# Patient Record
Sex: Female | Born: 1975
Health system: Southern US, Community
[De-identification: ages and names within clinical notes are randomized; demographics above are authoritative.]

## PROBLEM LIST (undated history)

## (undated) DIAGNOSIS — T7840XA Allergy, unspecified, initial encounter: Secondary | ICD-10-CM

## (undated) HISTORY — DX: Allergy, unspecified, initial encounter: T78.40XA

---

## 1996-03-01 HISTORY — PX: WISDOM TOOTH EXTRACTION: SHX21

## 1999-03-05 ENCOUNTER — Other Ambulatory Visit: Admission: RE | Admit: 1999-03-05 | Discharge: 1999-03-05 | Payer: Self-pay | Admitting: Obstetrics and Gynecology

## 2000-03-04 ENCOUNTER — Other Ambulatory Visit: Admission: RE | Admit: 2000-03-04 | Discharge: 2000-03-04 | Payer: Self-pay | Admitting: Obstetrics and Gynecology

## 2002-09-06 ENCOUNTER — Other Ambulatory Visit: Admission: RE | Admit: 2002-09-06 | Discharge: 2002-09-06 | Payer: Self-pay | Admitting: Family Medicine

## 2003-10-17 ENCOUNTER — Other Ambulatory Visit: Admission: RE | Admit: 2003-10-17 | Discharge: 2003-10-17 | Payer: Self-pay | Admitting: Family Medicine

## 2005-04-06 ENCOUNTER — Inpatient Hospital Stay (HOSPITAL_COMMUNITY): Admission: RE | Admit: 2005-04-06 | Discharge: 2005-04-09 | Payer: Self-pay | Admitting: Obstetrics & Gynecology

## 2010-05-20 ENCOUNTER — Encounter (HOSPITAL_COMMUNITY)
Admission: RE | Admit: 2010-05-20 | Discharge: 2010-05-20 | Disposition: A | Discharge status: Home / Self Care | Payer: PRIVATE HEALTH INSURANCE | Source: Ambulatory Visit | Attending: Obstetrics & Gynecology | Admitting: Obstetrics & Gynecology

## 2010-05-20 DIAGNOSIS — Z01812 Encounter for preprocedural laboratory examination: Secondary | ICD-10-CM | POA: Insufficient documentation

## 2010-05-20 LAB — CBC
HCT: 33.7 % — ABNORMAL LOW (ref 36.0–46.0)
MCH: 27.9 pg (ref 26.0–34.0)
MCV: 80.4 fL (ref 78.0–100.0)
Platelets: 323 10*3/uL (ref 150–400)
RBC: 4.19 MIL/uL (ref 3.87–5.11)
RDW: 13.9 % (ref 11.5–15.5)

## 2010-05-20 LAB — RPR: RPR Ser Ql: NONREACTIVE

## 2010-05-21 ENCOUNTER — Other Ambulatory Visit: Payer: Self-pay | Admitting: Obstetrics & Gynecology

## 2010-05-21 ENCOUNTER — Inpatient Hospital Stay (HOSPITAL_COMMUNITY)
Admission: RE | Admit: 2010-05-21 | Discharge: 2010-05-25 | DRG: 766 | Disposition: A | Discharge status: Home / Self Care | Payer: PRIVATE HEALTH INSURANCE | Source: Ambulatory Visit | Attending: Obstetrics & Gynecology | Admitting: Obstetrics & Gynecology

## 2010-05-21 DIAGNOSIS — D259 Leiomyoma of uterus, unspecified: Secondary | ICD-10-CM | POA: Diagnosis present

## 2010-05-21 DIAGNOSIS — O34219 Maternal care for unspecified type scar from previous cesarean delivery: Principal | ICD-10-CM | POA: Diagnosis present

## 2010-05-21 DIAGNOSIS — D4959 Neoplasm of unspecified behavior of other genitourinary organ: Secondary | ICD-10-CM | POA: Diagnosis present

## 2010-05-21 DIAGNOSIS — Z01812 Encounter for preprocedural laboratory examination: Secondary | ICD-10-CM

## 2010-05-21 DIAGNOSIS — O34599 Maternal care for other abnormalities of gravid uterus, unspecified trimester: Secondary | ICD-10-CM | POA: Diagnosis present

## 2010-05-21 DIAGNOSIS — Z01818 Encounter for other preprocedural examination: Secondary | ICD-10-CM

## 2010-05-21 DIAGNOSIS — O3660X0 Maternal care for excessive fetal growth, unspecified trimester, not applicable or unspecified: Secondary | ICD-10-CM | POA: Diagnosis present

## 2010-05-22 ENCOUNTER — Other Ambulatory Visit (HOSPITAL_COMMUNITY): Payer: PRIVATE HEALTH INSURANCE

## 2010-05-22 LAB — CBC
HCT: 31.8 % — ABNORMAL LOW (ref 36.0–46.0)
Hemoglobin: 10.8 g/dL — ABNORMAL LOW (ref 12.0–15.0)
MCH: 27.5 pg (ref 26.0–34.0)
MCHC: 34 g/dL (ref 30.0–36.0)
MCV: 80.9 fL (ref 78.0–100.0)
Platelets: 316 K/uL (ref 150–400)
RBC: 3.93 MIL/uL (ref 3.87–5.11)
RDW: 14 % (ref 11.5–15.5)
WBC: 13.4 K/uL — ABNORMAL HIGH (ref 4.0–10.5)

## 2010-05-22 LAB — ABO/RH: ABO/RH(D): O POS

## 2010-05-24 LAB — CBC
HCT: 29.5 % — ABNORMAL LOW (ref 36.0–46.0)
MCH: 28.1 pg (ref 26.0–34.0)
MCV: 79.7 fL (ref 78.0–100.0)
Platelets: 332 10*3/uL (ref 150–400)
RBC: 3.7 MIL/uL — ABNORMAL LOW (ref 3.87–5.11)
WBC: 10.2 10*3/uL (ref 4.0–10.5)

## 2010-05-29 ENCOUNTER — Inpatient Hospital Stay (HOSPITAL_COMMUNITY)
Admission: AD | Admit: 2010-05-29 | Payer: PRIVATE HEALTH INSURANCE | Source: Ambulatory Visit | Admitting: Obstetrics & Gynecology

## 2010-06-30 ENCOUNTER — Inpatient Hospital Stay (HOSPITAL_COMMUNITY): Admission: AD | Admit: 2010-06-30 | Payer: Self-pay | Source: Home / Self Care | Admitting: Obstetrics and Gynecology

## 2010-07-02 ENCOUNTER — Other Ambulatory Visit: Payer: Self-pay | Admitting: Obstetrics & Gynecology

## 2010-07-17 NOTE — Op Note (Signed)
[name], [name]            ACCOUNT NO.:  [id number]   MEDICAL RECORD NO.:  [id number]          PATIENT TYPE:  INP   LOCATION:  9132                          FACILITY:  [facility]   PHYSICIAN:  Genia Del, M.D.DATE OF BIRTH:  1975/05/29   DATE OF PROCEDURE:  04/06/2005  DATE OF DISCHARGE:                                 OPERATIVE REPORT   PREOPERATIVE DIAGNOSIS:  Thirty-nine plus weeks' gestation with suspicion of  macrosomia.   POSTOPERATIVE DIAGNOSIS:  Thirty-nine plus weeks' gestation with suspicion  of macrosomia.   INTERVENTION:  Elective primary low transverse cesarean section.   SURGEON:  Genia Del, M.D.   ASSISTANT:  Marlinda Mike, C.N.M.   ANESTHESIOLOGIST:  Burnett Corrente, M.D.   PROCEDURE:  Under spinal anesthesia, the patient is in a 15 degree low back  pain.  She is prepped with Betadine on the abdominal, suprapubic, vulvar and  vaginal area.  The bladder catheter is put in place and the patient is  draped as usual.  An infiltration of Marcaine 0.25% plain 10 mL is done at  the future site of Pfannenstiel.  We then make a Pfannenstiel incision with  a scalpel.  We open the adipose tissue and the aponeurosis transversely with  the electrocautery cutting mode.  We use the coagulation when necessary.  We  complete the opening of the aponeurosis on each side with Mayo scissors.  The aponeurosis is separate from the recti muscles on the midline superiorly  and inferiorly.  The parietal peritoneum is opened longitudinally with  Metzenbaum scissors.  We then put the bladder retractor in place.  The  visceral peritoneum is opened transversely with Metzenbaum scissors over the  lower uterine segment.  The bladder is reclined downward.  We then make a  low transverse hysterotomy with a scalpel, extend on each side with dressing  scissors.  The amniotic fluid is clear.  The fetus is in cephalic  presentation.  Birth of a baby boy at 12:45, a loose nuchal cord  is present.  We then suction the baby after delivery of the head.  The cord is clamped  and cut.  The baby is given to the neonatal team.  Apgars are 9 and 9.  The  weight of the baby is 11 pounds.  It is a baby boy.  We evacuate the  placenta spontaneously.  A cord blood was taken before, then the placenta  and the cord are sent for cord blood banking.  We make a uterine revision.  The uterus contracts well with Pitocin IV.  A dose of Ancef 1 g IV is given  after cord clamping.  We then close the hysterotomy with a first running  locked suture of 0 Vicryl.  A second plane in a mattress fashion is done  with 0 Vicryl.  Hemostasis is adequate.  We then verify hemostasis on the  bladder flap and the recti muscles.  It is completed with the electrocautery  where necessary.  Note that both ovaries were normal to inspection, both  tubes are normal to inspection.  The uterus had multiple myomas, especially  a  subserosal one on either side close to the fundus.  The one on the left  side was adherent to the omentum and it was freed from it with the  electrocautery with coagulation mode.  Hemostasis was adequate at both  levels.  We then close the aponeurosis with two half running sutures of 0  Vicryl.  We complete hemostasis on the adipose  tissue with the electrocautery and we approximate the skin with staples.  The count of instruments and sponges was complete x2.  A dry compressive  dressing was applied on the incision.  The estimated blood loss was 700 mL,  no complication occurred, and the patient was transferred to recovery room  in good, stable status.      Genia Del, M.D.  Electronically Signed     ML/MEDQ  D:  04/06/2005  T:  04/06/2005  Job:  [id number]

## 2010-07-17 NOTE — Discharge Summary (Signed)
[name], [name]            ACCOUNT NO.:  [id number]   MEDICAL RECORD NO.:  [id number]          PATIENT TYPE:  INP   LOCATION:  9132                          [facility]ILITY[facility]  [facility]   PHYSICIAN:  Genia Del, M.D.DATE OF BIRTH:  May 16, 1975   DATE OF ADMISSION:  04/06/2005  DATE OF DISCHARGE:  04/09/2005                                 DISCHARGE SUMMARY   ADMISSION DIAGNOSIS:  A 39+ weeks gestation, suspicion of macrosomia.   DISCHARGE DIAGNOSIS:  1.  A 39+ weeks gestation, suspicion of macrosomia.  2.  Confirmed macrosomia, baby boy 11 pounds.   INTERVENTION:  Elective low-transverse C-section.   HOSPITAL COURSE:  The patient was admitted on the day of her surgery.  She  had a primary low-transverse C-section.  A baby boy was born, 11 pounds,  Apgars 9 and 9.  The low-transverse hysterotomy was repaired in 2 plains.  The estimated blood loss was 700 mL.  No complication occurred.  The postop  evolution was unremarkable.  The patient remained afebrile and  hemodynamically stable.  Her hemoglobin postop was 11.3 with a hematocrit of  32.3.  The patient was discharged in stable status on postop day #3.  Postop  advice was given.  The patient was prescribed Percocet p.r.n. for pain.  She  will follow up at [facility] in 4 weeks for postop visit.      Genia Del, M.D.  Electronically Signed     ML/MEDQ  D:  04/29/2005  T:  04/29/2005  Job:  [id number]

## 2010-07-29 NOTE — Op Note (Signed)
[name], [name]            ACCOUNT NO.:  [id number]  MEDICAL RECORD NO.:  [id number]           PATIENT TYPE:  I  LOCATION:  9104                          [facility]ILITY:  [facility]  PHYSICIAN:  Genia Del, M.D.DATE OF BIRTH:  18-Jun-1975  DATE OF PROCEDURE:  05/21/2010 DATE OF DISCHARGE:                              OPERATIVE REPORT   PREOPERATIVE DIAGNOSES:  A 39 weeks' previous cesarean section, suspicion of macrosomia.  POSTOPERATIVE DIAGNOSES:  A 39 weeks' previous cesarean section, confirmed macrosomia, weight 10 pounds 14 ounces, myomas and adhesions.  PROCEDURES:  Repeat low-transverse cesarean section, lysis of adhesions and myomectomy.  SURGEON:  Genia Del, MD  ASSISTANT:  Arlan Organ, MD  ANESTHESIOLOGIST:  Brayton Caves, MD  PROCEDURE:  Under spinal anesthesia, the patient was in 15 degrees left decubitus position.  She was prepped with ChloraPrep on the abdomen and Betadine on the suprapubic vulvar and vaginal areas.  The Foley was put in place in the bladder and the patient was draped as usual.  The level of anesthesia was verified and was adequate.  We infiltrated the previous Pfannenstiel incision with Marcaine one-quarter plain 10 mL. We then proceed with a Pfannenstiel incision at the site of the previous scar with a scalpel.  We opened the adipose tissue with a scalpel and then with the electrocautery cutting mode.  We used the coag when necessary.  We opened the aponeurosis transversely with the electrocautery cutting mode and then with the Mayo scissors.  We then opened the parietal peritoneum longitudinally with [facility] scissors at the superior aspect.  When we arrived midway, there was very thick adhesions between the peritoneum and the uterus and the bladder inferiorly.  We very carefully released those adhesions with Mayo scissors and the electrocautery.  We were able to descent the bladder safely and reached the lower uterine segment.   The severe dense adhesions were on the midline.  We put the bladder retractor in place.  We make a low- transverse incision on the uterus with a scalpel.  We extend on each side with dressing scissors.  The amniotic fluid was clear.  The fetus was in cephalic presentation.  Birth of a baby girl at 53. The cord was clamped and cut.  The baby was suctioned and given to the Neonatal Team.  Apgars are 9 and 9, the weight was 10 pounds and 14 ounces.  The placenta was evacuated manually and sent to Labor and Delivery.  Uterine revision done.  Pitocin started in the IV fluid.  Note that, the patient received Ancef 1 g IV before starting the procedure.  The uterus contracts well.  We note severe adhesions between the omentum and anterior subserosal myoma.  That myoma was about 2.5-cm in diameter.  It was calcified and the decision was taken to proceed with a myomectomy to release the omental adhesions more easily.  We control hemostasis with the electrocautery and coag mode.  We also used Vicryl 3-0 on the omentum.  We closed the site where the myoma was with figure-of-eights with Vicryl 0 and then finished with a Vicryl 3-0.  Hemostasis was adequate at that level.  We also released some adhesions on the lower right side of the uterus with the peritoneum.  We had to close the first layer of the hysterotomy before proceeding to the myomectomy and so that was closed with a full plain locked suture of Vicryl 0 and a second plane in a mattress stitch of Vicryl 0 was done that completed hemostasis very well at that level, where we had to release adhesions before making the hysterotomy.  We repaired those areas with figure-of- eights with Vicryl 0 and that completed hemostasis at the same time. Both ovaries and both tubes were normal to inspection.  We irrigated and suctioned the abdominopelvic cavities.  We then used Interceed at the hysterotomy and also on the site of the myomectomy and where  the adhesions were released just superior to the incision on the uterus.  We then closed the parietal peritoneum with the running suture of Vicryl 2- 0.  We closed the aponeurosis with two half running suture of Vicryl 0, completed hemostasis on the adipose tissue with the electrocautery and reapproximated the skin with staples.  A compressive dry dressing was applied.  The count of instruments and sponges was complete.  The estimated blood loss was 800 mL.  No complications occurred and the patient was brought to recovery room in good stable status.     Genia Del, M.D.     ML/MEDQ  D:  05/21/2010  T:  05/22/2010  Job:  [id number]  Electronically Signed by Genia Del M.D. on 07/29/2010 05:03:00 PM

## 2013-01-08 DIAGNOSIS — E669 Obesity, unspecified: Secondary | ICD-10-CM | POA: Insufficient documentation

## 2013-01-08 DIAGNOSIS — E6609 Other obesity due to excess calories: Secondary | ICD-10-CM | POA: Insufficient documentation

## 2013-01-08 DIAGNOSIS — K5900 Constipation, unspecified: Secondary | ICD-10-CM | POA: Insufficient documentation

## 2015-06-17 DIAGNOSIS — Z1151 Encounter for screening for human papillomavirus (HPV): Secondary | ICD-10-CM | POA: Diagnosis not present

## 2015-06-17 DIAGNOSIS — Z01419 Encounter for gynecological examination (general) (routine) without abnormal findings: Secondary | ICD-10-CM | POA: Diagnosis not present

## 2015-06-17 DIAGNOSIS — Z6832 Body mass index (BMI) 32.0-32.9, adult: Secondary | ICD-10-CM | POA: Diagnosis not present

## 2015-06-24 ENCOUNTER — Ambulatory Visit (INDEPENDENT_AMBULATORY_CARE_PROVIDER_SITE_OTHER): Payer: BLUE CROSS/BLUE SHIELD | Admitting: Family Medicine

## 2015-06-24 ENCOUNTER — Encounter: Payer: Self-pay | Admitting: Family Medicine

## 2015-06-24 VITALS — BP 99/64 | HR 77 | Ht 62.0 in | Wt 178.0 lb

## 2015-06-24 DIAGNOSIS — E663 Overweight: Secondary | ICD-10-CM

## 2015-06-24 LAB — TSH: TSH: 2.61 mIU/L

## 2015-06-24 MED ORDER — PHENTERMINE HCL 37.5 MG PO TABS: 37.5000 mg | ORAL_TABLET | Freq: Every day | ORAL | Status: DC

## 2015-06-24 NOTE — Patient Instructions (Signed)
Other options in the future include: Qsymia, Belviq, Saxenda, or Contrave

## 2015-06-24 NOTE — Progress Notes (Signed)
CC: EXER [name] is a 40 y.o. female is here for Establish Care   Subjective: HPI:  [name] here to establish care.  She tells me that she wants some help with losing weight. She's tried her best to cut out junk food however just can't seem to be successful. No formal exercise routine. She's been trying to get into an exercise routine for many months now. Overall she's frustrated with lack of results but admits lack of aggressiveness to achieve these results. Symptoms have been slowly worsening with respect to weight gain over the past 2 or 3 years.   Review of Systems - General ROS: negative for - chills, fever, night sweats,  weight loss Ophthalmic ROS: negative for - decreased vision Psychological ROS: negative for - anxiety or depression ENT ROS: negative for - hearing change, nasal congestion, tinnitus or allergies Hematological and Lymphatic ROS: negative for - bleeding problems, bruising or swollen lymph nodes Breast ROS: negative Respiratory ROS: no cough, shortness of breath, or wheezing Cardiovascular ROS: no chest pain or dyspnea on exertion Gastrointestinal ROS: no abdominal pain, change in bowel habits, or black or bloody stools Genito-Urinary ROS: negative for - genital discharge, genital ulcers, incontinence or abnormal bleeding from genitals Musculoskeletal ROS: negative for - joint pain or muscle pain Neurological ROS: negative for - headaches or memory loss Dermatological ROS: negative for lumps, mole changes, rash and skin lesion changes  History reviewed. No pertinent past medical history.  Past Surgical History  Procedure Laterality Date  . Cesarean section  2007  . Cesarean section  2012   History reviewed. No pertinent family history.  Social History   Social History  . Marital Status: Married    Spouse Name: N/A  . Number of Children: N/A  . Years of Education: N/A   Occupational History  . Not on file.   Social History Main  Topics  . Smoking status: Never Smoker   . Smokeless tobacco: Never Used  . Alcohol Use: No  . Drug Use: No  . Sexual Activity: Yes    Birth Control/ Protection: IUD   Other Topics Concern  . Not on file   Social History Narrative  . No narrative on file     Objective: BP 99/64 mmHg  Pulse 77  Ht 5\' 2"  (1.575 m)  Wt 178 lb (80.74 kg)  BMI 32.55 kg/m2  General: Alert and Oriented, No Acute Distress HEENT: Pupils equal, round, reactive to light. Conjunctivae clear. Moist mucous membranes Lungs: Clear to auscultation bilaterally, no wheezing/ronchi/rales.  Comfortable work of breathing. Good air movement. Cardiac: Regular rate and rhythm. Normal S1/S2.  No murmurs, rubs, nor gallops.   Abdomen: Mild truncal obesity Extremities: No peripheral edema.  Strong peripheral pulses.  Mental Status: No depression, anxiety, nor agitation. Skin: Warm and dry.  Assessment & Plan: [name] was seen today for establish care.  Diagnoses and all orders for this visit:  Overweight -     TSH  Other orders -     phentermine (ADIPEX-P) 37.5 MG tablet; Take 1 tablet (37.5 mg total) by mouth daily before breakfast.   Overweight: Discussed importance of ruling out hypothyroidism and if normal she can start phentermine. We discussed that she'll need to actively be losing weight in order to get refills for this medication and that sometime in the next few months and probably lose its effectiveness and she'll need to switch to a different weight loss medication if she still desiring further weight loss. These  were options were provided to her in the patient instructions on her a AVS  Return if symptoms worsen or fail to improve.

## 2015-07-22 ENCOUNTER — Ambulatory Visit (INDEPENDENT_AMBULATORY_CARE_PROVIDER_SITE_OTHER): Payer: Self-pay | Admitting: Family Medicine

## 2015-07-22 VITALS — BP 111/72 | HR 86 | Wt 175.0 lb

## 2015-07-22 DIAGNOSIS — R635 Abnormal weight gain: Secondary | ICD-10-CM

## 2015-07-22 MED ORDER — PHENTERMINE HCL 37.5 MG PO TABS: 37.5000 mg | ORAL_TABLET | Freq: Every day | ORAL | Status: DC

## 2015-07-22 NOTE — Progress Notes (Signed)
Success, refill provided.

## 2015-07-22 NOTE — Progress Notes (Signed)
Patient is here for blood pressure and weight check. Denies any trouble sleeping, palpitations, or any other medication problems. Patient has lost weight. A refill for Phentermine will be sent to patient preferred pharmacy. Patient advised to schedule a four week nurse visit and keep her upcoming appointment with her PCP. Verbalized understanding, no further questions.

## 2015-08-25 ENCOUNTER — Ambulatory Visit: Payer: Self-pay

## 2015-09-04 ENCOUNTER — Ambulatory Visit (INDEPENDENT_AMBULATORY_CARE_PROVIDER_SITE_OTHER): Payer: Self-pay | Admitting: Osteopathic Medicine

## 2015-09-04 VITALS — BP 97/60 | HR 66 | Wt 177.0 lb

## 2015-09-04 DIAGNOSIS — R635 Abnormal weight gain: Secondary | ICD-10-CM

## 2015-09-04 NOTE — Progress Notes (Signed)
   Subjective:    Patient ID: [name], female    DOB: 07-Nov-1975, 40 y.o.   MRN: [id number]  HPI  Patient is here for blood pressure and weight check. Denies any trouble sleeping, palpitations, or any other medication problems.  Review of Systems     Objective:   Physical Exam        Assessment & Plan:   Patient has not loss weight. A refill for Phentermine will be reviewed by a provider and if appropriate will be faxed to the pharmacy on file. Patient advised that she will be contacted regarding refill. Verbalized understanding, no further questions.

## 2015-09-04 NOTE — Progress Notes (Signed)
Pt notified and advised to schedule appt with PCP to discuss alternate options. Pt verbalized understanding and was transferred to scheduling.

## 2015-09-04 NOTE — Progress Notes (Signed)
Nurse notes were reviewed. Patient has been taking phentermine at maximum dose for 2 months. Last refill was 07/22/2015. Has lost total of 1 pound since initiation of the medication. I would defer refill at this time, will route this note to her PCP, who may consider alternative medication for her or discuss diet/exercise changes.   Vitals - 1 value per visit 09/04/2015 07/22/2015 [id number]  SYSTOLIC [id number]  DIASTOLIC 60 72 64  Pulse 66 86 77  Weight (lb) 177 175 178  Height   5\' 2"   BMI 32.37 32 32.55  VISIT REPORT

## 2015-09-17 ENCOUNTER — Ambulatory Visit: Payer: Self-pay | Admitting: Family Medicine

## 2015-09-29 ENCOUNTER — Encounter: Payer: Self-pay | Admitting: Family Medicine

## 2015-09-29 ENCOUNTER — Ambulatory Visit (INDEPENDENT_AMBULATORY_CARE_PROVIDER_SITE_OTHER): Payer: 59 | Admitting: Family Medicine

## 2015-09-29 VITALS — BP 102/69 | HR 78 | Wt 177.0 lb

## 2015-09-29 DIAGNOSIS — E663 Overweight: Secondary | ICD-10-CM

## 2015-09-29 DIAGNOSIS — R454 Irritability and anger: Secondary | ICD-10-CM | POA: Diagnosis not present

## 2015-09-29 MED ORDER — BUPROPION HCL ER (XL) 150 MG PO TB24: 150.0000 mg | ORAL_TABLET | Freq: Every day | ORAL | 2 refills | Status: DC

## 2015-09-29 NOTE — Progress Notes (Signed)
CC: [name] is a 40 y.o. female is here for Weight Check   Subjective: HPI:  Follow-up overweight: She doesn't feel like phentermine was helping her lose weight. She believes that she stress eating and this could be causing her lack of weight loss.  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 She feels like she is bringing home stress and she is currently worried about work. She feels that this is causing some irritability and subjective depression. No thoughts or no himself or others. This been going on for months now.   Review Of Systems Outlined In HPI  No past medical history on file.  Past Surgical History:  Procedure Laterality Date  . CESAREAN SECTION  2007  . CESAREAN SECTION  2012   No family history on file.  Social History   Social History  . Marital status: Married    Spouse name: N/A  . Number of children: N/A  . Years of education: N/A   Occupational History  . Not on file.   Social History Main Topics  . Smoking status: Never Smoker  . Smokeless tobacco: Never Used  . Alcohol use No  . Drug use: No  . Sexual activity: Yes    Birth control/ protection: IUD   Other Topics Concern  . Not on file   Social History Narrative  . No narrative on file     Objective: BP 102/69   Pulse 78   Wt 177 lb (80.3 kg)   BMI 32.37 kg/m   Vital signs reviewed. General: Alert and Oriented, No Acute Distress HEENT: Pupils equal, round, reactive to light. Conjunctivae clear.  External ears unremarkable.  Moist mucous membranes. Lungs: Clear and comfortable work of breathing, speaking in full sentences without accessory muscle use. Cardiac: Regular rate and rhythm.  Neuro: CN II-XII grossly intact, gait normal. Extremities: No peripheral edema.  Strong peripheral pulses.  Mental Status: No anxiety, nor agitation. Logical though process. Appears mild to moderately  depressed when talking about her job stress. Skin: Warm and dry.  Assessment & Plan: [name] was seen today for weight check.  Diagnoses and all orders for this visit:  Overweight  Irritability -     buPROPion (WELLBUTRIN XL) 150 MG 24 hr tablet; Take 1 tablet (150 mg total) by mouth daily.   Overweight and irritable: She's heard about Wellbutrin before and that one of the side effects is weight loss. We will use this to help address her difficulty with losing weight and also irritability and depression from work. Follow-up in one month and we will also do a CPE.   ks (around 10/27/2015) for CPE.

## 2015-10-08 ENCOUNTER — Ambulatory Visit (INDEPENDENT_AMBULATORY_CARE_PROVIDER_SITE_OTHER): Payer: 59 | Admitting: Family Medicine

## 2015-10-08 ENCOUNTER — Encounter: Payer: Self-pay | Admitting: Family Medicine

## 2015-10-08 VITALS — BP 109/73 | HR 73 | Wt 176.0 lb

## 2015-10-08 DIAGNOSIS — Z0000 Encounter for general adult medical examination without abnormal findings: Secondary | ICD-10-CM

## 2015-10-08 LAB — CBC
HEMATOCRIT: 37.6 % (ref 35.0–45.0)
HEMOGLOBIN: 13.2 g/dL (ref 11.7–15.5)
MCH: 27.6 pg (ref 27.0–33.0)
MCHC: 35.1 g/dL (ref 32.0–36.0)
MCV: 78.7 fL — AB (ref 80.0–100.0)
MPV: 9.3 fL (ref 7.5–12.5)
PLATELETS: 366 10*3/uL (ref 140–400)
RBC: 4.78 MIL/uL (ref 3.80–5.10)
RDW: 13.9 % (ref 11.0–15.0)
WBC: 6.1 10*3/uL (ref 3.8–10.8)

## 2015-10-08 NOTE — Progress Notes (Signed)
CC: [name] is a 40 y.o. female is here for Annual Exam (pt is fasting )   Subjective: HPI:  Colonoscopy: No current indication Papsmear: UTD from May this year Mammogram: Discussed possibility of starting at age 80.  Influenza Vaccine: No current indication Pneumovax: No current indication Td/Tdap: UTD Zoster: (Start 40 yo)  She feels like Wellbutrin is helping without any known side effects. She is requesting a complete physical exam.  Review of Systems - General ROS: negative for - chills, fever, night sweats, weight gain or weight loss Ophthalmic ROS: negative for - decreased vision Psychological ROS: negative for - anxiety or depression ENT ROS: negative for - hearing change, nasal congestion, tinnitus or allergies Hematological and Lymphatic ROS: negative for - bleeding problems, bruising or swollen lymph nodes Breast ROS: negative Respiratory ROS: no cough, shortness of breath, or wheezing Cardiovascular ROS: no chest pain or dyspnea on exertion Gastrointestinal ROS: no abdominal pain, change in bowel habits, or black or bloody stools Genito-Urinary ROS: negative for - genital discharge, genital ulcers, incontinence or abnormal bleeding from genitals Musculoskeletal ROS: negative for - joint pain or muscle pain Neurological ROS: negative for - headaches or memory loss Dermatological ROS: negative for lumps, mole changes, rash and skin lesion changes  No past medical history on file.  Past Surgical History:  Procedure Laterality Date  . CESAREAN SECTION  2007  . CESAREAN SECTION  2012   No family history on file.  Social History   Social History  . Marital status: Married    Spouse name: N/A  . Number of children: N/A  . Years of education: N/A   Occupational History  . Not on file.   Social History Main Topics  . Smoking status: Never Smoker  . Smokeless tobacco: Never Used  . Alcohol use No  . Drug use: No  . Sexual activity: Yes    Birth  control/ protection: IUD   Other Topics Concern  . Not on file   Social History Narrative  . No narrative on file     Objective: BP 109/73   Pulse 73   Wt 176 lb (79.8 kg)   BMI 32.19 kg/m   General: No Acute Distress HEENT: Atraumatic, normocephalic, conjunctivae normal without scleral icterus.  No nasal discharge, hearing grossly intact, TMs with good landmarks bilaterally with no middle ear abnormalities, posterior pharynx clear without oral lesions. Neck: Supple, trachea midline, no cervical nor supraclavicular adenopathy. Pulmonary: Clear to auscultation bilaterally without wheezing, rhonchi, nor rales. Cardiac: Regular rate and rhythm.  No murmurs, rubs, nor gallops. No peripheral edema.  2+ peripheral pulses bilaterally. Abdomen: Bowel sounds normal.  No masses.  Non-tender without rebound.  Negative Murphy's sign. MSK: Grossly intact, no signs of weakness.  Full strength throughout upper and lower extremities.  Full ROM in upper and lower extremities.  No midline spinal tenderness. Neuro: Gait unremarkable, CN II-XII grossly intact.  C5-C6 Reflex 2/4 Bilaterally, L4 Reflex 2/4 Bilaterally.  Cerebellar function intact. Skin: No rashes. Psych: Alert and oriented to person/place/time.  Thought process normal. No anxiety/depression.   Assessment & Plan: [name] was seen today for annual exam.  Diagnoses and all orders for this visit:  Annual physical exam -     Lipid panel -     COMPLETE METABOLIC PANEL WITH GFR -     CBC  Healthy lifestyle interventions including but not limited to regular exercise, a healthy low fat diet, moderation of salt intake, the dangers of tobacco/alcohol/recreational drug  use, nutrition supplementation, and accident avoidance were discussed with the patient and a handout was provided for future reference.  Discussed with this patient that I will be resigning from my position here with [employer] [facility] [facility] in September in order to stay with my family who  will be moving to [facility]. I let him know about the providers that are still accepting patients and I feel that this individual will be under great care if he/she stays here with [facility]. Follow-up to be based on above labs.    Return if symptoms worsen or fail to improve.

## 2015-10-09 LAB — COMPLETE METABOLIC PANEL WITH GFR
ALBUMIN: 3.8 g/dL (ref 3.6–5.1)
ALK PHOS: 62 U/L (ref 33–115)
ALT: 10 U/L (ref 6–29)
AST: 13 U/L (ref 10–30)
BUN: 12 mg/dL (ref 7–25)
CHLORIDE: 103 mmol/L (ref 98–110)
CO2: 26 mmol/L (ref 20–31)
Calcium: 8.8 mg/dL (ref 8.6–10.2)
Creat: 0.79 mg/dL (ref 0.50–1.10)
GFR, Est African American: >89 mL/min (ref >=60)
GLUCOSE: 89 mg/dL (ref 65–99)
POTASSIUM: 4 mmol/L (ref 3.5–5.3)
SODIUM: 138 mmol/L (ref 135–146)
Total Bilirubin: 0.5 mg/dL (ref 0.2–1.2)
Total Protein: 6.4 g/dL (ref 6.1–8.1)

## 2015-10-09 LAB — LIPID PANEL
CHOL/HDL RATIO: 2.4 ratio (ref <=5.0)
Cholesterol: 147 mg/dL (ref 125–200)
HDL: 61 mg/dL (ref >=46)
LDL CALC: 79 mg/dL (ref <130)
Triglycerides: 35 mg/dL (ref <150)
VLDL: 7 mg/dL (ref <30)

## 2015-10-27 ENCOUNTER — Encounter: Payer: 59 | Admitting: Family Medicine

## 2015-12-17 ENCOUNTER — Ambulatory Visit (INDEPENDENT_AMBULATORY_CARE_PROVIDER_SITE_OTHER): Payer: 59 | Admitting: Osteopathic Medicine

## 2015-12-17 ENCOUNTER — Encounter: Payer: Self-pay | Admitting: Osteopathic Medicine

## 2015-12-17 VITALS — BP 111/75 | HR 73 | Ht 62.0 in | Wt 182.0 lb

## 2015-12-17 DIAGNOSIS — R635 Abnormal weight gain: Secondary | ICD-10-CM | POA: Diagnosis not present

## 2015-12-17 DIAGNOSIS — Z6833 Body mass index (BMI) 33.0-33.9, adult: Secondary | ICD-10-CM

## 2015-12-17 DIAGNOSIS — E669 Obesity, unspecified: Secondary | ICD-10-CM

## 2015-12-17 NOTE — Progress Notes (Signed)
FDHPI: [name] is a 40 y.o. female  who presents to [facility] today, 12/17/15,  for chief complaint of:  Chief Complaint  Patient presents with  . Establish Care    Patient will like to discuss weight: Previously on phentermine but taken off of this medication due to ineffectiveness. Patient has continued to gain some weight back.  Patient has had intermittent headaches ever since starting the Wellbutrin. Wants to know if this may be due to the medication. Recently changed jobs, much less rest at this point now that she has left her previous work environment.   Past medical, surgical, social and family history reviewed: History reviewed. No pertinent past medical history. Past Surgical History:  Procedure Laterality Date  . CESAREAN SECTION  2007  . CESAREAN SECTION  2012   Social History  Substance Use Topics  . Smoking status: Never Smoker  . Smokeless tobacco: Never Used  . Alcohol use No   History reviewed. No pertinent family history.   Current medication list and allergy/intolerance information reviewed:   Current Outpatient Prescriptions  Medication Sig Dispense Refill  . buPROPion (WELLBUTRIN XL) 150 MG 24 hr tablet Take 1 tablet (150 mg total) by mouth daily. 30 tablet 2   No current facility-administered medications for this visit.    No Known Allergies    Review of Systems:  Constitutional:  No  fever, no chills, No recent illness.   Cardiac: No  chest pain  Respiratory:  No  shortness of breath.  Gastrointestinal: No  abdominal pain, No  nausea  Exam:  BP 111/75   Pulse 73   Ht 5\' 2"  (1.575 m)   Wt 182 lb (82.6 kg)   BMI 33.29 kg/m   Constitutional: VS see above. General Appearance: alert, well-developed, well-nourished, NAD  Eyes: Normal lids and conjunctive, non-icteric sclera  Ears, Nose, Mouth, Throat: MMM,   Neck: No masses, trachea midline.   Respiratory: Normal respiratory  effort  Musculoskeletal: Gait normal.  Neurological: Normal balance/coordination. No tremor.   Skin: warm, dry, intact.    Psychiatric: Normal judgment/insight. Normal mood and affect. Oriented x3.     ASSESSMENT/PLAN:   Extensive discussion on weight loss issues. Patient given list of long-term medications to research and see what she thinks about these/figure out insurance coverage.  See patient instructions   Weight gain  Class 1 obesity without serious comorbidity with body mass index (BMI) of 33.0 to 33.9 in adult, unspecified obesity type   Patient Instructions  Things to remember for exercise for weight loss: Gradually increasing the intensity, frequency, or duration of your workout. Increased interval training and muscle strengthening exercises will help overall fitness and weight loss.   Things to remember for diet changes for weight loss: Calorie restriction with the goal weight loss of no more than one to one and a half pounds per week. Increase lean protein such as chicken, fish, [place]. Decrease fatty foods such as dairy, better. Decrease sugary foods. Increase fiber founded fruit and vegetables. Avoid sugary drinks such as soda or juice.  Medications approved for long-term use for obesity  Qsymia (Phentermine +Topiramate)  Saxenda (Liraglutide) $$$  Contrave (Bupropion + Naltrexone)  Lorcaserin (Belviq or Belviq XR)  Orlistat (Xenical, Alli)  Bupropion (Wellbutrin) Call insurance and ask about "coverage for anti-obesity medications, and what is on my formulary." Look up individual drug prices on GoodRx.com for cash prices.   1533 basal energy requirement (do not go below this number of  calories!), but anything less than 1800 calories per day should result in weight loss.       Visit summary with medication list and pertinent instructions WAS printed for patient to review. All questions at time of visit were answered - patient instructed to contact  office with any additional concerns. ER/RTC precautions were reviewed with the patient. Follow-up plan: Return for followup weight and when due for annual physical when has insurance again.  Note: Total time spent 25 minutes, greater than 50% of the visit was spent face-to-face counseling and coordinating care for the following: The primary encounter diagnosis was Weight gain. A diagnosis of Class 1 obesity without serious comorbidity with body mass index (BMI) of 33.0 to 33.9 in adult, unspecified obesity type was also pertinent to this visit.Marland Kitchen

## 2015-12-17 NOTE — Patient Instructions (Signed)
Things to remember for exercise for weight loss: Gradually increasing the intensity, frequency, or duration of your workout. Increased interval training and muscle strengthening exercises will help overall fitness and weight loss.   Things to remember for diet changes for weight loss: Calorie restriction with the goal weight loss of no more than one to one and a half pounds per week. Increase lean protein such as chicken, fish, [place]. Decrease fatty foods such as dairy, better. Decrease sugary foods. Increase fiber founded fruit and vegetables. Avoid sugary drinks such as soda or juice.  Medications approved for long-term use for obesity  Qsymia (Phentermine +Topiramate)  Saxenda (Liraglutide) $$$  Contrave (Bupropion + Naltrexone)  Lorcaserin (Belviq or Belviq XR)  Orlistat (Xenical, Alli)  Bupropion (Wellbutrin) Call insurance and ask about "coverage for anti-obesity medications, and what is on my formulary." Look up individual drug prices on GoodRx.com for cash prices.   1533 basal energy requirement (do not go below this number of calories!), but anything less than 1800 calories per day should result in weight loss.

## 2015-12-19 ENCOUNTER — Telehealth: Payer: Self-pay | Admitting: Osteopathic Medicine

## 2015-12-19 NOTE — Telephone Encounter (Signed)
-----   Message from Emeterio Reeve, DO sent at 12/18/2015  9:26 AM EDT ----- Regarding: cancel Rx non urgent Patient requests that we contact [facility] and cancel perception for Wellbutrin. Thanks in advance

## 2015-12-19 NOTE — Telephone Encounter (Signed)
Rx cancelled.

## 2015-12-21 MED ORDER — ORLISTAT 120 MG PO CAPS: 120.0000 mg | ORAL_CAPSULE | Freq: Three times a day (TID) | ORAL | 6 refills | Status: DC

## 2015-12-21 NOTE — Telephone Encounter (Signed)
Okay, sent orlistat. Caution, this can cause some GI upset/loose or oily stool. We can reduce dose if having problems with these side effects. Caution, avoid pregnancy in this medication.

## 2015-12-31 ENCOUNTER — Encounter: Payer: Self-pay | Admitting: Osteopathic Medicine

## 2016-06-24 DIAGNOSIS — H52223 Regular astigmatism, bilateral: Secondary | ICD-10-CM | POA: Diagnosis not present

## 2016-06-24 DIAGNOSIS — H5213 Myopia, bilateral: Secondary | ICD-10-CM | POA: Diagnosis not present

## 2016-06-25 ENCOUNTER — Encounter: Payer: Self-pay | Admitting: Obstetrics & Gynecology

## 2016-06-25 ENCOUNTER — Ambulatory Visit (INDEPENDENT_AMBULATORY_CARE_PROVIDER_SITE_OTHER): Payer: BLUE CROSS/BLUE SHIELD | Admitting: Obstetrics & Gynecology

## 2016-06-25 VITALS — BP 118/74 | Ht 65.0 in | Wt 186.0 lb

## 2016-06-25 DIAGNOSIS — Z3009 Encounter for other general counseling and advice on contraception: Secondary | ICD-10-CM

## 2016-06-25 DIAGNOSIS — Z30432 Encounter for removal of intrauterine contraceptive device: Secondary | ICD-10-CM | POA: Diagnosis not present

## 2016-06-25 DIAGNOSIS — Z01419 Encounter for gynecological examination (general) (routine) without abnormal findings: Secondary | ICD-10-CM

## 2016-06-25 NOTE — Patient Instructions (Signed)
Your Annual/Gyn exam was normal today. A Pap test was done and I will let you know the result as soon as available.  Please schedule a screening Mammo as soon as possible.  At your request, because you experience side effects, the Mirena IUD was removed.  For now, the decision was taken to use condoms with spermicides.  Other contraceptive options were discussed and you will call back if you decide to start on one of those.  Congratulation on your weight loss in the past few weeks.  I encourage you to continue with your fitness programs and start a low Carb diet like [employer]. [name], it was a pleasure to see you today!

## 2016-06-25 NOTE — Addendum Note (Signed)
Addended by: Nelva Nay on: 06/25/2016 04:52 PM   Modules accepted: Orders

## 2016-06-25 NOTE — Progress Notes (Signed)
[name] 1975-11-26 [id number]   History:    41 y.o. G2P2 married.  Daughter 33 yo, son is 74 yo.  Both doing well.  Established patient presenting for annual gyn exam.  Well on Mirena IUD x 10/2012, but gained weight and feels that the Progestin increases her appetite.  Desires removal of the IUD.  Menses light.  No pelvic pain.  Breasts wnl.  BMI 31 today.  On a fitness program.  Past medical history,surgical history, family history and social history were all reviewed and documented in the EPIC chart.  Gynecologic History Patient's last menstrual period was 06/04/2016. Contraception: IUD Last Pap: 2016. Results were: normal Last mammogram: 2016. Results were:   Obstetric History OB History  Gravida Para Term Preterm AB Living  2 2       2   SAB TAB Ectopic Multiple Live Births               # Outcome Date GA Lbr Len/2nd Weight Sex Delivery Anes PTL Lv  2 Para           1 Para                ROS: A ROS was performed and pertinent positives and negatives are included in the history.  GENERAL: No fevers or chills. HEENT: No change in vision, no earache, sore throat or sinus congestion. NECK: No pain or stiffness. CARDIOVASCULAR: No chest pain or pressure. No palpitations. PULMONARY: No shortness of breath, cough or wheeze. GASTROINTESTINAL: No abdominal pain, nausea, vomiting or diarrhea, melena or bright red blood per rectum. GENITOURINARY: No urinary frequency, urgency, hesitancy or dysuria. MUSCULOSKELETAL: No joint or muscle pain, no back pain, no recent trauma. DERMATOLOGIC: No rash, no itching, no lesions. ENDOCRINE: No polyuria, polydipsia, no heat or cold intolerance. No recent change in weight. HEMATOLOGICAL: No anemia or easy bruising or bleeding. NEUROLOGIC: No headache, seizures, numbness, tingling or weakness. PSYCHIATRIC: No depression, no loss of interest in normal activity or change in sleep pattern.     Exam:   BP 118/74   Ht 5\' 5"  (1.651 m)   Wt  186 lb (84.4 kg)   LMP 06/04/2016   BMI 30.95 kg/m   Body mass index is 30.95 kg/m.  General appearance : Well developed well nourished female. No acute distress HEENT: Eyes: no retinal hemorrhage or exudates,  Neck supple, trachea midline, no carotid bruits, no thyroidmegaly Lungs: Clear to auscultation, no rhonchi or wheezes, or rib retractions  Heart: Regular rate and rhythm, no murmurs or gallops Breast:Examined in sitting and supine position were symmetrical in appearance, no palpable masses or tenderness,  no skin retraction, no nipple inversion, no nipple discharge, no skin discoloration, no axillary or supraclavicular lymphadenopathy Abdomen: no palpable masses or tenderness, no rebound or guarding Extremities: no edema or skin discoloration or tenderness  Pelvic:  Bartholin, Urethra, Skene Glands: Within normal limits             Vagina: No gross lesions or discharge  Cervix: No gross lesions or discharge.  Pap done.  IUD removed easily by pulling on strings with a clamp.  Uterus  AV, normal size, shape and consistency, non-tender and mobile  Adnexa  Without masses or tenderness  Anus and perineum  normal    Assessment/Plan:  41 y.o. female for annual exam  1. Encounter for routine gynecological examination with Papanicolaou smear of cervix Normal gyn exam.  Pap done.  Will schedule screening Mammo.  2.  Encounter for removal of intrauterine contraceptive device (IUD) Removed easily at patient's request because of side effects.  3. Encounter for counseling regarding contraception After discussing different contraceptive methods, patient opts for Condoms with Spermicides.  Will probably call for low dose BCPs within a few weeks.   Princess Bruins MD, 4:11 PM 06/25/2016

## 2016-06-28 DIAGNOSIS — Z01419 Encounter for gynecological examination (general) (routine) without abnormal findings: Secondary | ICD-10-CM | POA: Diagnosis not present

## 2016-06-30 LAB — PAP IG W/ RFLX HPV ASCU

## 2016-07-12 ENCOUNTER — Other Ambulatory Visit: Payer: Self-pay | Admitting: Obstetrics & Gynecology

## 2016-07-12 DIAGNOSIS — Z1231 Encounter for screening mammogram for malignant neoplasm of breast: Secondary | ICD-10-CM

## 2016-08-02 ENCOUNTER — Ambulatory Visit
Admission: RE | Admit: 2016-08-02 | Discharge: 2016-08-02 | Disposition: A | Discharge status: Home / Self Care | Payer: BLUE CROSS/BLUE SHIELD | Source: Ambulatory Visit | Attending: Obstetrics & Gynecology | Admitting: Obstetrics & Gynecology

## 2016-08-02 DIAGNOSIS — Z1231 Encounter for screening mammogram for malignant neoplasm of breast: Secondary | ICD-10-CM | POA: Diagnosis not present

## 2016-11-03 DIAGNOSIS — L309 Dermatitis, unspecified: Secondary | ICD-10-CM | POA: Diagnosis not present

## 2016-11-03 DIAGNOSIS — L710 Perioral dermatitis: Secondary | ICD-10-CM | POA: Diagnosis not present

## 2016-12-22 DIAGNOSIS — Z23 Encounter for immunization: Secondary | ICD-10-CM | POA: Diagnosis not present

## 2017-01-12 DIAGNOSIS — L719 Rosacea, unspecified: Secondary | ICD-10-CM | POA: Diagnosis not present

## 2017-01-25 ENCOUNTER — Encounter: Payer: Self-pay | Admitting: Obstetrics & Gynecology

## 2017-01-27 ENCOUNTER — Other Ambulatory Visit: Payer: Self-pay | Admitting: Obstetrics & Gynecology

## 2017-01-27 MED ORDER — NORETHIN-ETH ESTRAD-FE BIPHAS 1 MG-10 MCG / 10 MCG PO TABS: 1.0000 | ORAL_TABLET | Freq: Every day | ORAL | 1 refills | Status: DC

## 2017-03-14 DIAGNOSIS — L853 Xerosis cutis: Secondary | ICD-10-CM | POA: Diagnosis not present

## 2017-03-14 DIAGNOSIS — L710 Perioral dermatitis: Secondary | ICD-10-CM | POA: Diagnosis not present

## 2017-03-14 DIAGNOSIS — L719 Rosacea, unspecified: Secondary | ICD-10-CM | POA: Diagnosis not present

## 2017-03-23 ENCOUNTER — Encounter: Payer: Self-pay | Admitting: Obstetrics & Gynecology

## 2017-06-08 ENCOUNTER — Encounter: Payer: Self-pay | Admitting: Osteopathic Medicine

## 2017-06-08 ENCOUNTER — Ambulatory Visit (INDEPENDENT_AMBULATORY_CARE_PROVIDER_SITE_OTHER): Payer: BLUE CROSS/BLUE SHIELD | Admitting: Osteopathic Medicine

## 2017-06-08 VITALS — BP 108/60 | HR 83 | Temp 98.4°F | Ht 62.0 in | Wt 184.0 lb

## 2017-06-08 DIAGNOSIS — Z0000 Encounter for general adult medical examination without abnormal findings: Secondary | ICD-10-CM

## 2017-06-08 DIAGNOSIS — R7309 Other abnormal glucose: Secondary | ICD-10-CM | POA: Diagnosis not present

## 2017-06-08 NOTE — Progress Notes (Signed)
HPI: [name] is a 42 y.o. female who  has no past medical history on file.  she presents to [facility] today, 06/08/17,  for chief complaint of: Check-up/Annual  Biometrics screening    Patient here for annual physical / wellness exam.  See preventive care reviewed as below.  Recent labs reviewed  Additional concerns today include:  None    Past medical, surgical, social and family history reviewed:  There are no active problems to display for this patient.   Past Surgical History:  Procedure Laterality Date  . CESAREAN SECTION  2007  . CESAREAN SECTION  2012    Social History   Tobacco Use  . Smoking status: Never Smoker  . Smokeless tobacco: Never Used  Substance Use Topics  . Alcohol use: No    No family history on file.   Current medication list and allergy/intolerance information reviewed:    Current Outpatient Medications  Medication Sig Dispense Refill  . levonorgestrel (MIRENA) 20 MCG/24HR IUD 1 each by Intrauterine route once.    . Norethindrone-Ethinyl Estradiol-Fe Biphas (LO LOESTRIN FE) 1 MG-10 MCG / 10 MCG tablet Take 1 tablet by mouth daily. 3 Package 1   No current facility-administered medications for this visit.     No Known Allergies    Review of Systems:  Constitutional:  No  fever, no chills, No recent illness, No unintentional weight changes. No significant fatigue.   HEENT: No  headache, no vision change, no hearing change, No sore throat, No  sinus pressure  Cardiac: No  chest pain, No  pressure, No palpitations, No  Orthopnea  Respiratory:  No  shortness of breath. No  Cough  Gastrointestinal: No  abdominal pain, No  nausea, No  vomiting,  No  blood in stool, No  diarrhea, No  constipation   Musculoskeletal: No new myalgia/arthralgia  Skin: No  Rash, No other wounds/concerning lesions  Genitourinary: No  incontinence, No  abnormal genital bleeding, No abnormal genital  discharge  Hem/Onc: No  easy bruising/bleeding  Endocrine: No cold intolerance,  No heat intolerance. No polyuria/polydipsia/polyphagia   Neurologic: No  weakness, No  dizziness  Psychiatric: No  concerns with depression, No  concerns with anxiety, No sleep problems, No mood problems  Exam:  BP 108/60   Pulse 83   Ht '5\' 2"'$  (1.575 m)   Wt 184 lb (83.5 kg)   BMI 33.65 kg/m   Constitutional: VS see above. General Appearance: alert, well-developed, well-nourished, NAD  Eyes: Normal lids and conjunctive, non-icteric sclera  Ears, Nose, Mouth, Throat: MMM, Normal external inspection ears/nares/mouth/lips/gums. TM normal bilaterally. Pharynx/tonsils no erythema, no exudate. Nasal mucosa normal.   Neck: No masses, trachea midline. No thyroid enlargement. No tenderness/mass appreciated. No lymphadenopathy  Respiratory: Normal respiratory effort. no wheeze, no rhonchi, no rales  Cardiovascular: S1/S2 normal, no murmur, no rub/gallop auscultated. RRR. No lower extremity edema. Pedal pulse II/IV bilaterally DP and PT.   Gastrointestinal: Nontender, no masses. No hepatomegaly, no splenomegaly. No hernia appreciated. Bowel sounds normal. Rectal exam deferred.   Musculoskeletal: Gait normal. No clubbing/cyanosis of digits.   Neurological: Normal balance/coordination. No tremor.   Skin: warm, dry, intact. No rash/ulcer. No concerning nevi or subq nodules on limited exam.    Psychiatric: Normal judgment/insight. Normal mood and affect. Oriented x3.      ASSESSMENT/PLAN:   Annual physical exam - Plan: COMPLETE METABOLIC PANEL WITH GFR, Lipid panel   FEMALE PREVENTIVE CARE Updated 06/08/17   ANNUAL  SCREENING/COUNSELING  Diet/Exercise - HEALTHY HABITS DISCUSSED TO DECREASE CV RISK Social History   Tobacco Use  Smoking Status Never Smoker  Smokeless Tobacco Never Used   Social History   Substance and Sexual Activity  Alcohol Use No   No flowsheet data found.  Domestic  violence concerns - no  HTN SCREENING - SEE [place]  Sexually active in the past year - Yes with female.  Need/want STI testing today? - no  Concerns about libido or pain with sex? - no  Plans for pregnancy? - no - on OCP  INFECTIOUS DISEASE SCREENING  HIV - does not need  GC/CT - does not need  HepC - DOB 1945-1965 - does not need  TB - does not need  DISEASE SCREENING  Lipid - needs  DM2 - needs  Osteoporosis - women age 18+ - does not need  CANCER SCREENING  Cervical - does not need  Breast - does not need  Lung - does not need  Colon - does not need  ADULT VACCINATION  Influenza - annual vaccine recommended  Td - booster every 10 years   Zoster - Shingrix recommended 50+  PCV13 - was not indicated  PPSV23 - was not indicated Immunization History  Administered Date(s) Administered  . Influenza-Unspecified 12/02/2015  . Tdap 05/21/2010      Patient Instructions  Preventive Care 40-64 Years, Female Preventive care refers to lifestyle choices and visits with your health care provider that can promote health and wellness. What does preventive care include?  A yearly physical exam. This is also called an annual well check.  Dental exams once or twice a year.  Routine eye exams. Ask your health care provider how often you should have your eyes checked.  Personal lifestyle choices, including: ? Daily care of your teeth and gums. ? Regular physical activity. ? Eating a healthy diet. ? Avoiding tobacco and drug use. ? Limiting alcohol use. ? Practicing safe sex. ? Taking low-dose aspirin daily starting at age 62. ? Taking vitamin and mineral supplements as recommended by your health care provider. What happens during an annual well check? The services and screenings done by your health care provider during your annual well check will depend on your age, overall health, lifestyle risk factors, and family history of  disease. Counseling Your health care provider may ask you questions about your:  Alcohol use.  Tobacco use.  Drug use.  Emotional well-being.  Home and relationship well-being.  Sexual activity.  Eating habits.  Work and work Statistician.  Method of birth control.  Menstrual cycle.  Pregnancy history.  Screening You may have the following tests or measurements:  Height, weight, and BMI.  Blood pressure.  Lipid and cholesterol levels. These may be checked every 5 years, or more frequently if you are over 69 years old.  Skin check.  Lung cancer screening. You may have this screening every year starting at age 13 if you have a 30-pack-year history of smoking and currently smoke or have quit within the past 15 years.  Fecal occult blood test (FOBT) of the stool. You may have this test every year starting at age 73.  Flexible sigmoidoscopy or colonoscopy. You may have a sigmoidoscopy every 5 years or a colonoscopy every 10 years starting at age 42.  Hepatitis C blood test.  Hepatitis B blood test.  Sexually transmitted disease (STD) testing.  Diabetes screening. This is done by checking your blood sugar (glucose) after you have  not eaten for a while (fasting). You may have this done every 1-3 years.  Mammogram. This may be done every 1-2 years. Talk to your health care provider about when you should start having regular mammograms. This may depend on whether you have a family history of breast cancer.  BRCA-related cancer screening. This may be done if you have a family history of breast, ovarian, tubal, or peritoneal cancers.  Pelvic exam and Pap test. This may be done every 3 years starting at age 58. Starting at age 35, this may be done every 5 years if you have a Pap test in combination with an HPV test.  Bone density scan. This is done to screen for osteoporosis. You may have this scan if you are at high risk for osteoporosis.  Discuss your test results,  treatment options, and if necessary, the need for more tests with your health care provider. Vaccines Your health care provider may recommend certain vaccines, such as:  Influenza vaccine. This is recommended every year.  Tetanus, diphtheria, and acellular pertussis (Tdap, Td) vaccine. You may need a Td booster every 10 years.  Varicella vaccine. You may need this if you have not been vaccinated.  Zoster vaccine. You may need this after age 21.  Measles, mumps, and rubella (MMR) vaccine. You may need at least one dose of MMR if you were born in 1957 or later. You may also need a second dose.  Pneumococcal 13-valent conjugate (PCV13) vaccine. You may need this if you have certain conditions and were not previously vaccinated.  Pneumococcal polysaccharide (PPSV23) vaccine. You may need one or two doses if you smoke cigarettes or if you have certain conditions.  Meningococcal vaccine. You may need this if you have certain conditions.  Hepatitis A vaccine. You may need this if you have certain conditions or if you travel or work in places where you may be exposed to hepatitis A.  Hepatitis B vaccine. You may need this if you have certain conditions or if you travel or work in places where you may be exposed to hepatitis B.  Haemophilus influenzae type b (Hib) vaccine. You may need this if you have certain conditions.  Talk to your health care provider about which screenings and vaccines you need and how often you need them. This information is not intended to replace advice given to you by your health care provider. Make sure you discuss any questions you have with your health care provider. Document Released: 03/14/2015 Document Revised: 11/05/2015 Document Reviewed: 12/17/2014 Elsevier Interactive Patient Education  2018 [employer].     Visit summary with medication list and pertinent instructions was printed for patient to review. All questions at time of visit were answered -  patient instructed to contact office with any additional concerns. ER/RTC precautions were reviewed with the patient.   Follow-up plan: Return in about 1 year (around 06/09/2018) for annual physical, sooner if needed .    Please note: voice recognition software was used to produce this document, and typos may escape review. Please contact Dr. Sheppard Coil for any needed clarifications.

## 2017-06-08 NOTE — Patient Instructions (Signed)
Preventive Care 40-64 Years, Female Preventive care refers to lifestyle choices and visits with your health care provider that can promote health and wellness. What does preventive care include?  A yearly physical exam. This is also called an annual well check.  Dental exams once or twice a year.  Routine eye exams. Ask your health care provider how often you should have your eyes checked.  Personal lifestyle choices, including: ? Daily care of your teeth and gums. ? Regular physical activity. ? Eating a healthy diet. ? Avoiding tobacco and drug use. ? Limiting alcohol use. ? Practicing safe sex. ? Taking low-dose aspirin daily starting at age 42. ? Taking vitamin and mineral supplements as recommended by your health care provider. What happens during an annual well check? The services and screenings done by your health care provider during your annual well check will depend on your age, overall health, lifestyle risk factors, and family history of disease. Counseling Your health care provider may ask you questions about your:  Alcohol use.  Tobacco use.  Drug use.  Emotional well-being.  Home and relationship well-being.  Sexual activity.  Eating habits.  Work and work Statistician.  Method of birth control.  Menstrual cycle.  Pregnancy history.  Screening You may have the following tests or measurements:  Height, weight, and BMI.  Blood pressure.  Lipid and cholesterol levels. These may be checked every 5 years, or more frequently if you are over 42 years old.  Skin check.  Lung cancer screening. You may have this screening every year starting at age 42 if you have a 30-pack-year history of smoking and currently smoke or have quit within the past 15 years.  Fecal occult blood test (FOBT) of the stool. You may have this test every year starting at age 42.  Flexible sigmoidoscopy or colonoscopy. You may have a sigmoidoscopy every 5 years or a colonoscopy  every 10 years starting at age 42.  Hepatitis C blood test.  Hepatitis B blood test.  Sexually transmitted disease (STD) testing.  Diabetes screening. This is done by checking your blood sugar (glucose) after you have not eaten for a while (fasting). You may have this done every 1-3 years.  Mammogram. This may be done every 1-2 years. Talk to your health care provider about when you should start having regular mammograms. This may depend on whether you have a family history of breast cancer.  BRCA-related cancer screening. This may be done if you have a family history of breast, ovarian, tubal, or peritoneal cancers.  Pelvic exam and Pap test. This may be done every 3 years starting at age 42. Starting at age 42, this may be done every 5 years if you have a Pap test in combination with an HPV test.  Bone density scan. This is done to screen for osteoporosis. You may have this scan if you are at high risk for osteoporosis.  Discuss your test results, treatment options, and if necessary, the need for more tests with your health care provider. Vaccines Your health care provider may recommend certain vaccines, such as:  Influenza vaccine. This is recommended every year.  Tetanus, diphtheria, and acellular pertussis (Tdap, Td) vaccine. You may need a Td booster every 10 years.  Varicella vaccine. You may need this if you have not been vaccinated.  Zoster vaccine. You may need this after age 42.  Measles, mumps, and rubella (MMR) vaccine. You may need at least one dose of MMR if you were born in  1957 or later. You may also need a second dose.  Pneumococcal 13-valent conjugate (PCV13) vaccine. You may need this if you have certain conditions and were not previously vaccinated.  Pneumococcal polysaccharide (PPSV23) vaccine. You may need one or two doses if you smoke cigarettes or if you have certain conditions.  Meningococcal vaccine. You may need this if you have certain  conditions.  Hepatitis A vaccine. You may need this if you have certain conditions or if you travel or work in places where you may be exposed to hepatitis A.  Hepatitis B vaccine. You may need this if you have certain conditions or if you travel or work in places where you may be exposed to hepatitis B.  Haemophilus influenzae type b (Hib) vaccine. You may need this if you have certain conditions.  Talk to your health care provider about which screenings and vaccines you need and how often you need them. This information is not intended to replace advice given to you by your health care provider. Make sure you discuss any questions you have with your health care provider. Document Released: 03/14/2015 Document Revised: 11/05/2015 Document Reviewed: 12/17/2014 Elsevier Interactive Patient Education  2018 [employer]  

## 2017-06-09 ENCOUNTER — Encounter: Payer: Self-pay | Admitting: Osteopathic Medicine

## 2017-06-13 DIAGNOSIS — R7301 Impaired fasting glucose: Secondary | ICD-10-CM | POA: Diagnosis not present

## 2017-06-13 DIAGNOSIS — Z0000 Encounter for general adult medical examination without abnormal findings: Secondary | ICD-10-CM | POA: Diagnosis not present

## 2017-06-15 LAB — HEMOGLOBIN A1C W/OUT EAG

## 2017-06-15 LAB — COMPLETE METABOLIC PANEL WITH GFR
AG Ratio: 1.5 (calc) (ref 1.0–2.5)
ALBUMIN MSPROF: 3.9 g/dL (ref 3.6–5.1)
ALT: 9 U/L (ref 6–29)
AST: 10 U/L (ref 10–30)
Alkaline phosphatase (APISO): 72 U/L (ref 33–115)
BUN: 14 mg/dL (ref 7–25)
CALCIUM: 8.7 mg/dL (ref 8.6–10.2)
CO2: 28 mmol/L (ref 20–32)
CREATININE: 0.87 mg/dL (ref 0.50–1.10)
Chloride: 106 mmol/L (ref 98–110)
GFR, EST NON AFRICAN AMERICAN: 83 mL/min/{1.73_m2} (ref >=60)
GFR, Est African American: 96 mL/min/{1.73_m2} (ref >=60)
GLOBULIN: 2.6 g/dL (ref 1.9–3.7)
Glucose, Bld: 108 mg/dL — ABNORMAL HIGH (ref 65–99)
Potassium: 4.1 mmol/L (ref 3.5–5.3)
SODIUM: 139 mmol/L (ref 135–146)
TOTAL PROTEIN: 6.5 g/dL (ref 6.1–8.1)
Total Bilirubin: 0.3 mg/dL (ref 0.2–1.2)

## 2017-06-15 LAB — LIPID PANEL
CHOL/HDL RATIO: 2.6 (calc) (ref <5.0)
Cholesterol: 129 mg/dL (ref <200)
HDL: 49 mg/dL — AB (ref >50)
LDL Cholesterol (Calc): 69 mg/dL (calc)
NON-HDL CHOLESTEROL (CALC): 80 mg/dL (ref <130)
Triglycerides: 37 mg/dL (ref <150)

## 2017-06-16 NOTE — Addendum Note (Signed)
Addended by: Maryla Morrow on: 06/16/2017 04:06 PM   Modules accepted: Orders

## 2017-06-27 ENCOUNTER — Encounter: Payer: Self-pay | Admitting: Osteopathic Medicine

## 2017-06-27 DIAGNOSIS — H5213 Myopia, bilateral: Secondary | ICD-10-CM | POA: Diagnosis not present

## 2017-06-28 ENCOUNTER — Ambulatory Visit (INDEPENDENT_AMBULATORY_CARE_PROVIDER_SITE_OTHER): Payer: BLUE CROSS/BLUE SHIELD | Admitting: Obstetrics & Gynecology

## 2017-06-28 ENCOUNTER — Other Ambulatory Visit: Payer: Self-pay | Admitting: Obstetrics & Gynecology

## 2017-06-28 ENCOUNTER — Encounter: Payer: Self-pay | Admitting: Obstetrics & Gynecology

## 2017-06-28 VITALS — BP 132/86 | Ht 64.5 in | Wt 186.0 lb

## 2017-06-28 DIAGNOSIS — Z3041 Encounter for surveillance of contraceptive pills: Secondary | ICD-10-CM

## 2017-06-28 DIAGNOSIS — Z01419 Encounter for gynecological examination (general) (routine) without abnormal findings: Secondary | ICD-10-CM

## 2017-06-28 DIAGNOSIS — E6609 Other obesity due to excess calories: Secondary | ICD-10-CM

## 2017-06-28 DIAGNOSIS — Z6831 Body mass index (BMI) 31.0-31.9, adult: Secondary | ICD-10-CM

## 2017-06-28 DIAGNOSIS — Z1231 Encounter for screening mammogram for malignant neoplasm of breast: Secondary | ICD-10-CM

## 2017-06-28 MED ORDER — NORETHIN-ETH ESTRAD-FE BIPHAS 1 MG-10 MCG / 10 MCG PO TABS: 1.0000 | ORAL_TABLET | Freq: Every day | ORAL | 4 refills | Status: DC

## 2017-06-28 NOTE — Patient Instructions (Signed)
1. Well female exam with routine gynecological exam Normal gynecologic exam.  Pap test negative April 2018.  Will repeat Pap next year.  Breast exam normal.  Will schedule next screening mammogram in June 2019.  Health labs with family physician.  Hemoglobin A1c planned for next week.  2. Encounter for surveillance of contraceptive pills Birth control pill with low Loestrin FE well-tolerated.  No contraindication.  Same prescription sent to pharmacy.  3. Class 1 obesity due to excess calories without serious comorbidity with body mass index (BMI) of 31.0 to 31.9 in adult Encouraged to exercise more frequently with aerobics recommended 5 times a week and weightlifting every 2 days.  Low calorie/low carb diet recommended, [employer] would be a good choice.  Other orders - Norethindrone-Ethinyl Estradiol-Fe Biphas (LO LOESTRIN FE) 1 MG-10 MCG / 10 MCG tablet; Take 1 tablet by mouth daily.  [name], it was a pleasure seeing you today!

## 2017-06-28 NOTE — Progress Notes (Signed)
[name] 1975-11-18 [id number]   History:    42 y.o. G2P2L2 Married.  Daughter is 38, son is 53 yo.  RP:  Established patient presenting for annual gyn exam   HPI: Well on Lo Loestrin Fe.  No pelvic pain.  Normal vaginal secretions.  No pain with IC.  Urine/BMs wnl.  Breasts wnl.  BMI 31.43.  Will increase fitness, plans to walk/run while at daughter's track practices.  Planning to decrease fast food/eating out.  Fasting glucose 108 (possibly not fasting), will do HBA1C this week with Fam MD.  Past medical history,surgical history, family history and social history were all reviewed and documented in the EPIC chart.  Gynecologic History Patient's last menstrual period was 06/17/2017. Contraception: OCP (estrogen/progesterone) Last Pap: 06/28/2016. Results were: Negative Last mammogram: 08/02/2016. Results were: Negative Bone Density: Never Colonoscopy: Never  Obstetric History OB History  Gravida Para Term Preterm AB Living  2 2       2   SAB TAB Ectopic Multiple Live Births               # Outcome Date GA Lbr Len/2nd Weight Sex Delivery Anes PTL Lv  2 Para           1 Para              ROS: A ROS was performed and pertinent positives and negatives are included in the history.  GENERAL: No fevers or chills. HEENT: No change in vision, no earache, sore throat or sinus congestion. NECK: No pain or stiffness. CARDIOVASCULAR: No chest pain or pressure. No palpitations. PULMONARY: No shortness of breath, cough or wheeze. GASTROINTESTINAL: No abdominal pain, nausea, vomiting or diarrhea, melena or bright red blood per rectum. GENITOURINARY: No urinary frequency, urgency, hesitancy or dysuria. MUSCULOSKELETAL: No joint or muscle pain, no back pain, no recent trauma. DERMATOLOGIC: No rash, no itching, no lesions. ENDOCRINE: No polyuria, polydipsia, no heat or cold intolerance. No recent change in weight. HEMATOLOGICAL: No anemia or easy bruising or bleeding. NEUROLOGIC: No  headache, seizures, numbness, tingling or weakness. PSYCHIATRIC: No depression, no loss of interest in normal activity or change in sleep pattern.     Exam:   BP 132/86   Ht 5' 4.5" (1.638 m)   Wt 186 lb (84.4 kg)   LMP 06/17/2017 Comment: pill  BMI 31.43 kg/m   Body mass index is 31.43 kg/m.  General appearance : Well developed well nourished female. No acute distress HEENT: Eyes: no retinal hemorrhage or exudates,  Neck supple, trachea midline, no carotid bruits, no thyroidmegaly Lungs: Clear to auscultation, no rhonchi or wheezes, or rib retractions  Heart: Regular rate and rhythm, no murmurs or gallops Breast:Examined in sitting and supine position were symmetrical in appearance, no palpable masses or tenderness,  no skin retraction, no nipple inversion, no nipple discharge, no skin discoloration, no axillary or supraclavicular lymphadenopathy Abdomen: no palpable masses or tenderness, no rebound or guarding Extremities: no edema or skin discoloration or tenderness  Pelvic: Vulva: Normal             Vagina: No gross lesions or discharge  Cervix: No gross lesions or discharge  Uterus  AV, normal size, shape and consistency, non-tender and mobile  Adnexa  Without masses or tenderness  Anus: Normal   Assessment/Plan:  42 y.o. female for annual exam   1. Well female exam with routine gynecological exam Normal gynecologic exam.  Pap test negative April 2018.  Will repeat Pap next year.  Breast exam normal.  Will schedule next screening mammogram in June 2019.  Health labs with family physician.  Hemoglobin A1c planned for next week.  2. Encounter for surveillance of contraceptive pills Birth control pill with low Loestrin FE well-tolerated.  No contraindication.  Same prescription sent to pharmacy.  3. Class 1 obesity due to excess calories without serious comorbidity with body mass index (BMI) of 31.0 to 31.9 in adult Encouraged to exercise more frequently with aerobics  recommended 5 times a week and weightlifting every 2 days.  Low calorie/low carb diet recommended, [employer] would be a good choice.  Other orders - Norethindrone-Ethinyl Estradiol-Fe Biphas (LO LOESTRIN FE) 1 MG-10 MCG / 10 MCG tablet; Take 1 tablet by mouth daily.  Princess Bruins MD, 4:18 PM 06/28/2017

## 2017-08-04 ENCOUNTER — Ambulatory Visit
Admission: RE | Admit: 2017-08-04 | Discharge: 2017-08-04 | Disposition: A | Discharge status: Home / Self Care | Payer: BLUE CROSS/BLUE SHIELD | Source: Ambulatory Visit | Attending: Obstetrics & Gynecology | Admitting: Obstetrics & Gynecology

## 2017-08-04 DIAGNOSIS — Z1231 Encounter for screening mammogram for malignant neoplasm of breast: Secondary | ICD-10-CM

## 2017-09-13 ENCOUNTER — Encounter: Payer: Self-pay | Admitting: Obstetrics & Gynecology

## 2018-04-04 ENCOUNTER — Encounter: Payer: Self-pay | Admitting: Osteopathic Medicine

## 2018-04-04 ENCOUNTER — Other Ambulatory Visit: Payer: Self-pay | Admitting: Obstetrics & Gynecology

## 2018-04-04 DIAGNOSIS — Z0000 Encounter for general adult medical examination without abnormal findings: Secondary | ICD-10-CM

## 2018-04-04 DIAGNOSIS — Z1231 Encounter for screening mammogram for malignant neoplasm of breast: Secondary | ICD-10-CM

## 2018-06-13 ENCOUNTER — Encounter: Payer: BLUE CROSS/BLUE SHIELD | Admitting: Osteopathic Medicine

## 2018-06-30 ENCOUNTER — Encounter: Payer: BLUE CROSS/BLUE SHIELD | Admitting: Obstetrics & Gynecology

## 2018-07-03 ENCOUNTER — Other Ambulatory Visit: Payer: Self-pay

## 2018-07-04 ENCOUNTER — Ambulatory Visit (INDEPENDENT_AMBULATORY_CARE_PROVIDER_SITE_OTHER): Payer: BLUE CROSS/BLUE SHIELD | Admitting: Obstetrics & Gynecology

## 2018-07-04 ENCOUNTER — Encounter: Payer: Self-pay | Admitting: Obstetrics & Gynecology

## 2018-07-04 VITALS — BP 124/88 | Ht 63.0 in | Wt 196.6 lb

## 2018-07-04 DIAGNOSIS — Z6834 Body mass index (BMI) 34.0-34.9, adult: Secondary | ICD-10-CM | POA: Diagnosis not present

## 2018-07-04 DIAGNOSIS — Z3041 Encounter for surveillance of contraceptive pills: Secondary | ICD-10-CM | POA: Diagnosis not present

## 2018-07-04 DIAGNOSIS — Z01419 Encounter for gynecological examination (general) (routine) without abnormal findings: Secondary | ICD-10-CM | POA: Diagnosis not present

## 2018-07-04 DIAGNOSIS — E6609 Other obesity due to excess calories: Secondary | ICD-10-CM | POA: Diagnosis not present

## 2018-07-04 MED ORDER — NORETHIN-ETH ESTRAD-FE BIPHAS 1 MG-10 MCG / 10 MCG PO TABS: 1.0000 | ORAL_TABLET | Freq: Every day | ORAL | 4 refills | Status: DC

## 2018-07-04 NOTE — Progress Notes (Signed)
ADALEENA MOOERS [birth year] [id number]   History:    43 y.o. G2P2L2 Married.  Son is 77, daughter is 90 yo.  RP:  Established patient presenting for annual gyn exam   HPI: Well on Lo Loestrin.  No breakthrough bleeding.  No pelvic pain.  No pain with intercourse.  Urine and bowel movements normal.  Breasts normal.  Last mammogram June 2019 was negative.  Body mass index 34.83.  Health labs with family physician.  Past medical history,surgical history, family history and social history were all reviewed and documented in the EPIC chart.  Gynecologic History Patient's last menstrual period was 04/02/2018. Contraception: OCP (estrogen/progesterone) Last Pap: 05/2016. Results were: Negative Last mammogram: 07/2017. Results were: Negative Bone Density: Never Colonoscopy: Never  Obstetric History OB History  Gravida Para Term Preterm AB Living  2 2       2   SAB TAB Ectopic Multiple Live Births               # Outcome Date GA Lbr Len/2nd Weight Sex Delivery Anes PTL Lv  2 Para           1 Para              ROS: A ROS was performed and pertinent positives and negatives are included in the history.  GENERAL: No fevers or chills. HEENT: No change in vision, no earache, sore throat or sinus congestion. NECK: No pain or stiffness. CARDIOVASCULAR: No chest pain or pressure. No palpitations. PULMONARY: No shortness of breath, cough or wheeze. GASTROINTESTINAL: No abdominal pain, nausea, vomiting or diarrhea, melena or bright red blood per rectum. GENITOURINARY: No urinary frequency, urgency, hesitancy or dysuria. MUSCULOSKELETAL: No joint or muscle pain, no back pain, no recent trauma. DERMATOLOGIC: No rash, no itching, no lesions. ENDOCRINE: No polyuria, polydipsia, no heat or cold intolerance. No recent change in weight. HEMATOLOGICAL: No anemia or easy bruising or bleeding. NEUROLOGIC: No headache, seizures, numbness, tingling or weakness. PSYCHIATRIC: No depression, no loss of interest in  normal activity or change in sleep pattern.     Exam:   BP 124/88   Ht 5\' 3"  (1.6 m)   Wt 196 lb 9.6 oz (89.2 kg)   LMP 04/02/2018 Comment: lo-loestrin  BMI 34.83 kg/m   Body mass index is 34.83 kg/m.  General appearance : Well developed well nourished female. No acute distress HEENT: Eyes: no retinal hemorrhage or exudates,  Neck supple, trachea midline, no carotid bruits, no thyroidmegaly Lungs: Clear to auscultation, no rhonchi or wheezes, or rib retractions  Heart: Regular rate and rhythm, no murmurs or gallops Breast:Examined in sitting and supine position were symmetrical in appearance, no palpable masses or tenderness,  no skin retraction, no nipple inversion, no nipple discharge, no skin discoloration, no axillary or supraclavicular lymphadenopathy Abdomen: no palpable masses or tenderness, no rebound or guarding Extremities: no edema or skin discoloration or tenderness  Pelvic: Vulva: Normal             Vagina: No gross lesions or discharge  Cervix: No gross lesions or discharge  Uterus  AV, normal size, shape and consistency, non-tender and mobile  Adnexa  Without masses or tenderness  Anus: Normal   Assessment/Plan:  43 y.o. female for annual exam   1. Well female exam with routine gynecological exam Normal gynecologic exam.  Pap test in April 2018 was negative, will repeat Pap test next year.  Breast exam normal.  Will schedule screening mammogram in June 2020.  Health labs with family physician.  2. Encounter for surveillance of contraceptive pills Well on Lo Loestrin.  No contraindication for birth control pills.  Same birth control pill represcribed.  3. Class 1 obesity due to excess calories without serious comorbidity with body mass index (BMI) of 34.0 to 34.9 in adult Low calorie/carb diet such as [employer] recommended.  Recommend aerobic physical activities 5 times a week and weightlifting every 2 days.  Other orders - Norethindrone-Ethinyl  Estradiol-Fe Biphas (LO LOESTRIN FE) 1 MG-10 MCG / 10 MCG tablet; Take 1 tablet by mouth daily.  Princess Bruins MD, 1:48 PM 07/04/2018

## 2018-07-07 ENCOUNTER — Encounter: Payer: Self-pay | Admitting: Obstetrics & Gynecology

## 2018-07-07 NOTE — Patient Instructions (Signed)
1. Well female exam with routine gynecological exam Normal gynecologic exam.  Pap test in April 2018 was negative, will repeat Pap test next year.  Breast exam normal.  Will schedule screening mammogram in June 2020.  Health labs with family physician.  2. Encounter for surveillance of contraceptive pills Well on Lo Loestrin.  No contraindication for birth control pills.  Same birth control pill represcribed.  3. Class 1 obesity due to excess calories without serious comorbidity with body mass index (BMI) of 34.0 to 34.9 in adult Low calorie/carb diet such as [employer] recommended.  Recommend aerobic physical activities 5 times a week and weightlifting every 2 days.  Other orders - Norethindrone-Ethinyl Estradiol-Fe Biphas (LO LOESTRIN FE) 1 MG-10 MCG / 10 MCG tablet; Take 1 tablet by mouth daily.  Aleen, it was a pleasure seeing you today!

## 2018-07-13 MED ORDER — NORETHIN-ETH ESTRAD-FE BIPHAS 1 MG-10 MCG / 10 MCG PO TABS: 1.0000 | ORAL_TABLET | Freq: Every day | ORAL | 4 refills | Status: DC

## 2018-07-18 ENCOUNTER — Ambulatory Visit (INDEPENDENT_AMBULATORY_CARE_PROVIDER_SITE_OTHER): Payer: BLUE CROSS/BLUE SHIELD | Admitting: Osteopathic Medicine

## 2018-07-18 ENCOUNTER — Encounter: Payer: Self-pay | Admitting: Osteopathic Medicine

## 2018-07-18 VITALS — BP 108/71 | HR 77 | Ht 63.0 in | Wt 195.0 lb

## 2018-07-18 DIAGNOSIS — Z0000 Encounter for general adult medical examination without abnormal findings: Secondary | ICD-10-CM

## 2018-07-18 NOTE — Progress Notes (Signed)
HPI: [name] is a 43 y.o. female who  has no past medical history on file.  she presents to [facility] today, 07/18/18,  for chief complaint of: Check-up/Annual  Biometrics screening    Patient here for annual physical / wellness exam.  See preventive care reviewed as below.  Recent labs - ordered 04/2018   Additional concerns today include:  None    Past medical, surgical, social and family history reviewed:  There are no active problems to display for this patient.   Past Surgical History:  Procedure Laterality Date  . CESAREAN SECTION  2007  . CESAREAN SECTION  2012    Social History   Tobacco Use  . Smoking status: Never Smoker  . Smokeless tobacco: Never Used  Substance Use Topics  . Alcohol use: No    Family History  Problem Relation Age of Onset  . Diabetes Maternal Grandmother   . Diabetes Maternal Grandfather      Current medication list and allergy/intolerance information reviewed:    Current Outpatient Medications  Medication Sig Dispense Refill  . Norethindrone-Ethinyl Estradiol-Fe Biphas (LO LOESTRIN FE) 1 MG-10 MCG / 10 MCG tablet Take 1 tablet by mouth daily. 3 Package 4   No current facility-administered medications for this visit.     No Known Allergies    Review of Systems:  Constitutional:  No  fever, no chills, No recent illness, No unintentional weight changes. No significant fatigue.   HEENT: No  headache, no vision change, no hearing change, No sore throat, No  sinus pressure  Cardiac: No  chest pain, No  pressure, No palpitations, No  Orthopnea  Respiratory:  No  shortness of breath. No  Cough  Gastrointestinal: No  abdominal pain, No  nausea, No  vomiting,  No  blood in stool, No  diarrhea, No  constipation   Musculoskeletal: No new myalgia/arthralgia  Skin: No  Rash, No other wounds/concerning lesions  Genitourinary: No  incontinence, No  abnormal genital bleeding, No  abnormal genital discharge  Hem/Onc: No  easy bruising/bleeding  Endocrine: No cold intolerance,  No heat intolerance. No polyuria/polydipsia/polyphagia   Neurologic: No  weakness, No  dizziness  Psychiatric: No  concerns with depression, No  concerns with anxiety, No sleep problems, No mood problems  Exam:  BP 108/71   Pulse 77   Ht 5\' 3"  (1.6 m)   Wt 195 lb (88.5 kg)   SpO2 98%   BMI 34.54 kg/m   Constitutional: VS see above. General Appearance: alert, well-developed, well-nourished, NAD  Eyes: Normal lids and conjunctive, non-icteric sclera  Ears, Nose, Mouth, Throat: MMM, Normal external inspection ears/nares/mouth/lips/gums. TM normal bilaterally. Pharynx/tonsils no erythema, no exudate. Nasal mucosa normal.   Neck: No masses, trachea midline. No thyroid enlargement. No tenderness/mass appreciated. No lymphadenopathy  Respiratory: Normal respiratory effort. no wheeze, no rhonchi, no rales  Cardiovascular: S1/S2 normal, no murmur, no rub/gallop auscultated. RRR. No lower extremity edema. Pedal pulse II/IV bilaterally DP and PT.   Gastrointestinal: Nontender, no masses. No hepatomegaly, no splenomegaly. No hernia appreciated. Bowel sounds normal. Rectal exam deferred.   Musculoskeletal: Gait normal. No clubbing/cyanosis of digits.   Neurological: Normal balance/coordination. No tremor.   Skin: warm, dry, intact. No rash/ulcer. No concerning nevi or subq nodules on limited exam.    Psychiatric: Normal judgment/insight. Normal mood and affect. Oriented x3.      ASSESSMENT/PLAN:   No diagnosis found.    Patient Instructions  General Preventive Care  Most recent routine screening lipids/other labs: ordered today   Everyone should have blood pressure checked once per year. Goal 130/80 or less.   Tobacco: don't!   Alcohol: responsible moderation is ok for most adults - if you have concerns about your alcohol intake, please talk to me!   Exercise: as tolerated  to reduce risk of cardiovascular disease and diabetes. Strength training will also prevent osteoporosis.   Mental health: if need for mental health care (medicines, counseling, other), or concerns about moods, please let me know!   Sexual health: if ever a need for STD testing, or if concerns with libido/pain problems, please let me know! If you need to discuss your birth control options, please let me know!   Advanced Directive: Living Will and/or Healthcare Power of Attorney recommended for all adults, regardless of age or health.  Vaccines  Flu vaccine: recommended for almost everyone, every fall.   Shingles vaccine: Shingrix recommended after age 44.   Pneumonia vaccines: Prevnar and Pneumovax recommended after age 43, or sooner if certain medical conditions.  Tetanus booster: Tdap recommended every 10 years. Due 04/2020 Cancer screenings   Colon cancer screening: recommended for everyone at age 79  Breast cancer screening: mammogram recommended at age 20 every other year at least, and annually after age 55.   Cervical cancer screening: Pap every 1 to 5 years depending on age and other risk factors. Can usually stop at age 9 or w/ hysterectomy.   Lung cancer screening: not needed for non-smokers  Infection screenings . HIV, Gonorrhea/Chlamydia: screening as needed . Hepatitis C: recommended for anyone born 35-1965 . TB: certain at-risk populations, or depending on work requirements and/or travel history Other . Bone Density Test: recommended for women at age 9         Visit summary with medication list and pertinent instructions was printed for patient to review. All questions at time of visit were answered - patient instructed to contact office with any additional concerns. ER/RTC precautions were reviewed with the patient.   Follow-up plan: Return in about 1 year (around 07/18/2019) for annual physical .    Please note: voice recognition software was used to  produce this document, and typos may escape review. Please contact Dr. Sheppard Coil for any needed clarifications.

## 2018-07-18 NOTE — Patient Instructions (Addendum)
General Preventive Care  Most recent routine screening lipids/other labs: ordered today   Everyone should have blood pressure checked once per year. Goal 130/80 or less.   Tobacco: don't!   Alcohol: responsible moderation is ok for most adults - if you have concerns about your alcohol intake, please talk to me!   Exercise: as tolerated to reduce risk of cardiovascular disease and diabetes. Strength training will also prevent osteoporosis.   Mental health: if need for mental health care (medicines, counseling, other), or concerns about moods, please let me know!   Sexual health: if ever a need for STD testing, or if concerns with libido/pain problems, please let me know! If you need to discuss your birth control options, please let me know!   Advanced Directive: Living Will and/or Healthcare Power of Attorney recommended for all adults, regardless of age or health.  Vaccines  Flu vaccine: recommended for almost everyone, every fall.   Shingles vaccine: Shingrix recommended after age 24.   Pneumonia vaccines: Prevnar and Pneumovax recommended after age 3, or sooner if certain medical conditions.  Tetanus booster: Tdap recommended every 10 years. Due 04/2020 Cancer screenings   Colon cancer screening: recommended for everyone at age 52  Breast cancer screening: mammogram recommended at age 31 every other year at least, and annually after age 36.   Cervical cancer screening: Pap every 1 to 5 years depending on age and other risk factors. Can usually stop at age 60 or w/ hysterectomy.   Lung cancer screening: not needed for non-smokers  Infection screenings . HIV, Gonorrhea/Chlamydia: screening as needed . Hepatitis C: recommended for anyone born 71-1965 . TB: certain at-risk populations, or depending on work requirements and/or travel history Other . Bone Density Test: recommended for women at age 55

## 2018-07-19 LAB — LIPID PANEL
Cholesterol: 185 mg/dL (ref <200)
HDL: 50 mg/dL (ref >=50)
LDL Cholesterol (Calc): 119 mg/dL (calc) — ABNORMAL HIGH
NON-HDL CHOLESTEROL (CALC): 135 mg/dL — AB (ref <130)
TRIGLYCERIDES: 64 mg/dL (ref <150)
Total CHOL/HDL Ratio: 3.7 (calc) (ref <5.0)

## 2018-07-19 LAB — CBC
HCT: 40.4 % (ref 35.0–45.0)
HEMOGLOBIN: 13.8 g/dL (ref 11.7–15.5)
MCH: 28 pg (ref 27.0–33.0)
MCHC: 34.2 g/dL (ref 32.0–36.0)
MCV: 82.1 fL (ref 80.0–100.0)
MPV: 9.9 fL (ref 7.5–12.5)
Platelets: 435 10*3/uL — ABNORMAL HIGH (ref 140–400)
RBC: 4.92 10*6/uL (ref 3.80–5.10)
RDW: 13.1 % (ref 11.0–15.0)
WBC: 6.7 10*3/uL (ref 3.8–10.8)

## 2018-07-19 LAB — COMPREHENSIVE METABOLIC PANEL
AG RATIO: 1.6 (calc) (ref 1.0–2.5)
ALBUMIN MSPROF: 4.2 g/dL (ref 3.6–5.1)
ALT: 12 U/L (ref 6–29)
AST: 12 U/L (ref 10–30)
Alkaline phosphatase (APISO): 73 U/L (ref 31–125)
BUN: 9 mg/dL (ref 7–25)
CALCIUM: 9.3 mg/dL (ref 8.6–10.2)
CHLORIDE: 104 mmol/L (ref 98–110)
CO2: 26 mmol/L (ref 20–32)
Creat: 0.91 mg/dL (ref 0.50–1.10)
GLUCOSE: 98 mg/dL (ref 65–99)
Globulin: 2.7 g/dL (calc) (ref 1.9–3.7)
POTASSIUM: 3.9 mmol/L (ref 3.5–5.3)
Sodium: 138 mmol/L (ref 135–146)
TOTAL PROTEIN: 6.9 g/dL (ref 6.1–8.1)
Total Bilirubin: 0.6 mg/dL (ref 0.2–1.2)

## 2018-07-19 LAB — TSH: TSH: 1.41 m[IU]/L

## 2018-08-17 ENCOUNTER — Other Ambulatory Visit: Payer: Self-pay

## 2018-08-17 ENCOUNTER — Ambulatory Visit
Admission: RE | Admit: 2018-08-17 | Discharge: 2018-08-17 | Disposition: A | Discharge status: Home / Self Care | Payer: BC Managed Care – PPO | Source: Ambulatory Visit | Attending: Obstetrics & Gynecology | Admitting: Obstetrics & Gynecology

## 2018-08-17 DIAGNOSIS — Z1231 Encounter for screening mammogram for malignant neoplasm of breast: Secondary | ICD-10-CM | POA: Diagnosis not present

## 2018-08-23 DIAGNOSIS — H5213 Myopia, bilateral: Secondary | ICD-10-CM | POA: Diagnosis not present

## 2018-08-23 DIAGNOSIS — Z135 Encounter for screening for eye and ear disorders: Secondary | ICD-10-CM | POA: Diagnosis not present

## 2018-08-23 DIAGNOSIS — H52223 Regular astigmatism, bilateral: Secondary | ICD-10-CM | POA: Diagnosis not present

## 2019-05-10 ENCOUNTER — Other Ambulatory Visit: Payer: Self-pay | Admitting: Obstetrics & Gynecology

## 2019-05-10 DIAGNOSIS — Z1231 Encounter for screening mammogram for malignant neoplasm of breast: Secondary | ICD-10-CM

## 2019-05-17 ENCOUNTER — Other Ambulatory Visit: Payer: Self-pay | Admitting: Obstetrics & Gynecology

## 2019-06-21 DIAGNOSIS — L710 Perioral dermatitis: Secondary | ICD-10-CM | POA: Diagnosis not present

## 2019-06-21 DIAGNOSIS — L309 Dermatitis, unspecified: Secondary | ICD-10-CM | POA: Diagnosis not present

## 2019-07-05 ENCOUNTER — Other Ambulatory Visit: Payer: Self-pay

## 2019-07-06 ENCOUNTER — Ambulatory Visit (INDEPENDENT_AMBULATORY_CARE_PROVIDER_SITE_OTHER): Payer: BC Managed Care – PPO | Admitting: Obstetrics & Gynecology

## 2019-07-06 ENCOUNTER — Encounter: Payer: Self-pay | Admitting: Obstetrics & Gynecology

## 2019-07-06 VITALS — BP 110/70 | Ht 63.0 in | Wt 187.0 lb

## 2019-07-06 DIAGNOSIS — E66811 Obesity, class 1: Secondary | ICD-10-CM

## 2019-07-06 DIAGNOSIS — Z3009 Encounter for other general counseling and advice on contraception: Secondary | ICD-10-CM

## 2019-07-06 DIAGNOSIS — Z6833 Body mass index (BMI) 33.0-33.9, adult: Secondary | ICD-10-CM

## 2019-07-06 DIAGNOSIS — Z01419 Encounter for gynecological examination (general) (routine) without abnormal findings: Secondary | ICD-10-CM | POA: Diagnosis not present

## 2019-07-06 DIAGNOSIS — E6609 Other obesity due to excess calories: Secondary | ICD-10-CM | POA: Diagnosis not present

## 2019-07-06 NOTE — Progress Notes (Signed)
[name] 11-08-1975 [id number]   History:    44 y.o. G2P2L2 Married.  Son is 25, daughter is 82 yo.  RP:  Established patient presenting for annual gyn exam   HPI: Well on Lo Loestrin.  No breakthrough bleeding.  Would like to change to the Mirena IUD.  No pelvic pain.  No pain with intercourse.  Urine and bowel movements normal.  Breasts normal.  Last mammogram June 2020 was negative.  Body mass index 33.13.  Health labs with family physician.   Past medical history,surgical history, family history and social history were all reviewed and documented in the EPIC chart.  Gynecologic History Patient's last menstrual period was 06/11/2019.  Obstetric History OB History  Gravida Para Term Preterm AB Living  2 2       2   SAB TAB Ectopic Multiple Live Births               # Outcome Date GA Lbr Len/2nd Weight Sex Delivery Anes PTL Lv  2 Para           1 Para              ROS: A ROS was performed and pertinent positives and negatives are included in the history.  GENERAL: No fevers or chills. HEENT: No change in vision, no earache, sore throat or sinus congestion. NECK: No pain or stiffness. CARDIOVASCULAR: No chest pain or pressure. No palpitations. PULMONARY: No shortness of breath, cough or wheeze. GASTROINTESTINAL: No abdominal pain, nausea, vomiting or diarrhea, melena or bright red blood per rectum. GENITOURINARY: No urinary frequency, urgency, hesitancy or dysuria. MUSCULOSKELETAL: No joint or muscle pain, no back pain, no recent trauma. DERMATOLOGIC: No rash, no itching, no lesions. ENDOCRINE: No polyuria, polydipsia, no heat or cold intolerance. No recent change in weight. HEMATOLOGICAL: No anemia or easy bruising or bleeding. NEUROLOGIC: No headache, seizures, numbness, tingling or weakness. PSYCHIATRIC: No depression, no loss of interest in normal activity or change in sleep pattern.     Exam:   BP 110/70   Ht 5\' 3"  (1.6 m)   Wt 187 lb (84.8 kg)   LMP  06/11/2019   BMI 33.13 kg/m   Body mass index is 33.13 kg/m.  General appearance : Well developed well nourished female. No acute distress HEENT: Eyes: no retinal hemorrhage or exudates,  Neck supple, trachea midline, no carotid bruits, no thyroidmegaly Lungs: Clear to auscultation, no rhonchi or wheezes, or rib retractions  Heart: Regular rate and rhythm, no murmurs or gallops Breast:Examined in sitting and supine position were symmetrical in appearance, no palpable masses or tenderness,  no skin retraction, no nipple inversion, no nipple discharge, no skin discoloration, no axillary or supraclavicular lymphadenopathy Abdomen: no palpable masses or tenderness, no rebound or guarding Extremities: no edema or skin discoloration or tenderness  Pelvic: Vulva: Normal             Vagina: No gross lesions or discharge  Cervix: No gross lesions or discharge.  Pap reflex done.  Uterus  RV, normal size, shape and consistency, non-tender and mobile  Adnexa  Without masses or tenderness  Anus: Normal   Assessment/Plan:  44 y.o. female for annual exam   1. Encounter for routine gynecological examination with Papanicolaou smear of cervix Normal gynecologic exam.  Pap reflex done.  Breast exam normal.  Screening mammogram in June 2020 was negative.  Health labs with family physician.  2. Encounter for other general counseling or advice on contraception  After counseling on contraception, decision to use Mirena IUD.  Risks and benefits reviewed.  Insertion procedure discussed.  F/U Mirena IUD insertion.  3. Class 1 obesity due to excess calories without serious comorbidity with body mass index (BMI) of 33.0 to 33.9 in adult [place] 5 weeks ago.  Lost 7 Lbs already.  Recommend aerobic activities 5 times a week and light weightlifting every 2 days.  Other orders - ampicillin (PRINCIPEN) 500 MG capsule; Take 500 mg by mouth 4 (four) times daily.  Princess Bruins MD, 2:17 PM  07/06/2019

## 2019-07-10 LAB — PAP IG W/ RFLX HPV ASCU

## 2019-07-11 ENCOUNTER — Encounter: Payer: Self-pay | Admitting: Obstetrics & Gynecology

## 2019-07-11 ENCOUNTER — Ambulatory Visit: Payer: BC Managed Care – PPO | Admitting: Obstetrics & Gynecology

## 2019-07-11 ENCOUNTER — Encounter: Payer: Self-pay | Admitting: *Deleted

## 2019-07-11 NOTE — Patient Instructions (Signed)
1. Encounter for routine gynecological examination with Papanicolaou smear of cervix Normal gynecologic exam.  Pap reflex done.  Breast exam normal.  Screening mammogram in June 2020 was negative.  Health labs with family physician.  2. Encounter for other general counseling or advice on contraception After counseling on contraception, decision to use Mirena IUD.  Risks and benefits reviewed.  Insertion procedure discussed.  F/U Mirena IUD insertion.  3. Class 1 obesity due to excess calories without serious comorbidity with body mass index (BMI) of 33.0 to 33.9 in adult [place] 5 weeks ago.  Lost 7 Lbs already.  Recommend aerobic activities 5 times a week and light weightlifting every 2 days.  Other orders - ampicillin (PRINCIPEN) 500 MG capsule; Take 500 mg by mouth 4 (four) times daily.  Cheray, it was a pleasure seeing you today!  I will inform you of your results as soon as they are available.

## 2019-07-18 ENCOUNTER — Ambulatory Visit (INDEPENDENT_AMBULATORY_CARE_PROVIDER_SITE_OTHER): Payer: BC Managed Care – PPO | Admitting: Osteopathic Medicine

## 2019-07-18 VITALS — BP 107/67 | HR 82 | Ht 63.0 in | Wt 183.0 lb

## 2019-07-18 DIAGNOSIS — Z0000 Encounter for general adult medical examination without abnormal findings: Secondary | ICD-10-CM

## 2019-07-18 NOTE — Progress Notes (Signed)
[name] is a 44 y.o. female who presents to  [place] at [facility]  today, 07/18/19, seeking care for the following: . Annual physical      ASSESSMENT & PLAN with other pertinent history/findings:  The encounter diagnosis was Annual physical exam.     Patient Instructions  General Preventive Care  Most recent routine screening labs: ordered today.   Everyone should have blood pressure checked once per year. Goal 130/80 or less.   Tobacco: don't! Alcohol: responsible moderation is ok for most adults - if you have concerns about your alcohol intake, please talk to me!   Exercise: as tolerated to reduce risk of cardiovascular disease and diabetes. Strength training will also prevent osteoporosis.   Mental health: if need for mental health care (medicines, counseling, other), or concerns about moods, please let me know!   Sexual health: if need for STD testing, or if concerns with libido/pain problems, or need to discuss family planning, please let me or OBGYN know!  Advanced Directive: Living Will and/or Healthcare Power of Attorney recommended for all adults, regardless of age or health.  Vaccines  Flu vaccine: for almost everyone, every fall.   Shingles vaccine: after age 27.   Pneumonia vaccines: after age 11  Tetanus booster: every 10 years - due 2022  COVID vaccine: thanks for getting vaccinated!  Cancer screenings   Colon cancer screening: for everyone age 49-75. Colonoscopy available for all, many people also qualify for the Cologuard stool test   Breast cancer screening: mammogram at age 54 every other year at least, and annually after age 18.   Cervical cancer screening: Pap every 1 to 5 years depending on age and other risk factors. Can usually stop at age 70 or w/ hysterectomy.   Lung cancer screening: not needed for non-smokers  Infection screenings  . HIV: CDC recommends screening at least once  age 56-65, more often as needed. . Gonorrhea/Chlamydia: screening as needed . Hepatitis C: CDC recommends once for everyone age 55-75 . TB: certain at-risk populations Other . Bone Density Test: recommended for women at age 55    Orders Placed This Encounter  Procedures  . CBC  . COMPLETE METABOLIC PANEL WITH GFR  . Lipid panel    No orders of the defined types were placed in this encounter.      Follow-up instructions: Return in about 1 year (around 07/17/2020) for [place] (get labs prior to visit, orders are in).                                      Constitutional:  . VSS, see nurse notes . General Appearance: alert, well-developed, well-nourished, NAD Eyes: Marland Kitchen Normal lids and conjunctive, non-icteric sclera . PERRLA Ears, Nose, Mouth, Throat: . Normal appearance . MMM, posterior pharynx without erythema/exudate Neck: . No masses, trachea midline . No thyroid enlargement/tenderness/mass appreciated Respiratory: . Normal respiratory effort . Breath sounds normal, no wheeze/rhonchi/rales Cardiovascular: . S1/S2 normal, no murmur/rub/gallop auscultated . Pedal pulse II/IV bilaterally DP and PT . No lower extremity edema Gastrointestinal: . Nontender, no masses . No hepatomegaly, no splenomegaly . No hernia appreciated Musculoskeletal:  . Gait normal . No clubbing/cyanosis of digits Neurological: . No cranial nerve deficit on limited exam . Motor and sensation intact and symmetric Psychiatric: . Normal judgment/insight . Normal mood and affect    BP 107/67  Pulse 82   Ht 5\' 3"  (1.6 m)   Wt 183 lb (83 kg)   SpO2 100%   BMI 32.42 kg/m   Current Meds  Medication Sig  . ampicillin (PRINCIPEN) 500 MG capsule Take 500 mg by mouth 4 (four) times daily.  . LO LOESTRIN FE 1 MG-10 MCG / 10 MCG tablet Take 1 tablet by mouth once daily    Results for orders placed or performed in visit on 07/18/19 (from the past 72 hour(s))   CBC     Status: None   Collection Time: 07/18/19  9:42 AM  Result Value Ref Range   WBC 6.7 3.8 - 10.8 Thousand/uL   RBC 4.88 3.80 - 5.10 Million/uL   Hemoglobin 13.9 11.7 - 15.5 g/dL   HCT 40.7 35.0 - 45.0 %   MCV 83.4 80.0 - 100.0 fL   MCH 28.5 27.0 - 33.0 pg   MCHC 34.2 32.0 - 36.0 g/dL   RDW 12.5 11.0 - 15.0 %   Platelets 388 140 - 400 Thousand/uL   MPV 9.7 7.5 - 12.5 fL    No results found.  Depression screen [facility] 2/9 07/18/2019 06/08/2017  Decreased Interest 0 0  Down, Depressed, Hopeless 0 0  PHQ - 2 Score 0 0  Altered sleeping 0 0  Tired, decreased energy 1 0  Change in appetite 1 0  Feeling bad or failure about yourself  0 0  Trouble concentrating 0 0  Moving slowly or fidgety/restless 0 0  Suicidal thoughts 0 0  PHQ-9 Score 2 0  Difficult doing work/chores Not difficult at all Not difficult at all    GAD 7 : Generalized Anxiety Score 07/18/2019 06/08/2017  Nervous, Anxious, on Edge 0 0  Control/stop worrying 0 0  Worry too much - different things 0 0  Trouble relaxing 0 0  Restless 0 0  Easily annoyed or irritable 0 0  Afraid - awful might happen 0 0  Total GAD 7 Score 0 0  Anxiety Difficulty Not difficult at all Not difficult at all      All questions at time of visit were answered - patient instructed to contact office with any additional concerns or updates.  ER/RTC precautions were reviewed with the patient.  Please note: voice recognition software was used to produce this document, and typos may escape review. Please contact Dr. Sheppard Coil for any needed clarifications.

## 2019-07-18 NOTE — Patient Instructions (Addendum)
General Preventive Care  Most recent routine screening labs: ordered today.   Everyone should have blood pressure checked once per year. Goal 130/80 or less.   Tobacco: don't! Alcohol: responsible moderation is ok for most adults - if you have concerns about your alcohol intake, please talk to me!   Exercise: as tolerated to reduce risk of cardiovascular disease and diabetes. Strength training will also prevent osteoporosis.   Mental health: if need for mental health care (medicines, counseling, other), or concerns about moods, please let me know!   Sexual health: if need for STD testing, or if concerns with libido/pain problems, or need to discuss family planning, please let me or OBGYN know!  Advanced Directive: Living Will and/or Healthcare Power of Attorney recommended for all adults, regardless of age or health.  Vaccines  Flu vaccine: for almost everyone, every fall.   Shingles vaccine: after age 54.   Pneumonia vaccines: after age 19  Tetanus booster: every 10 years - due 2022  COVID vaccine: thanks for getting vaccinated!  Cancer screenings   Colon cancer screening: for everyone age 20-75. Colonoscopy available for all, many people also qualify for the Cologuard stool test   Breast cancer screening: mammogram at age 39 every other year at least, and annually after age 75.   Cervical cancer screening: Pap every 1 to 5 years depending on age and other risk factors. Can usually stop at age 72 or w/ hysterectomy.   Lung cancer screening: not needed for non-smokers  Infection screenings  . HIV: CDC recommends screening at least once age 9-65, more often as needed. . Gonorrhea/Chlamydia: screening as needed . Hepatitis C: CDC recommends once for everyone age 1-75 . TB: certain at-risk populations Other . Bone Density Test: recommended for women at age 59

## 2019-07-19 LAB — LIPID PANEL
Cholesterol: 169 mg/dL (ref <200)
HDL: 58 mg/dL (ref >=50)
LDL Cholesterol (Calc): 96 mg/dL (calc)
Non-HDL Cholesterol (Calc): 111 mg/dL (calc) (ref <130)
Total CHOL/HDL Ratio: 2.9 (calc) (ref <5.0)
Triglycerides: 60 mg/dL (ref <150)

## 2019-07-19 LAB — COMPLETE METABOLIC PANEL WITH GFR
AG Ratio: 1.4 (calc) (ref 1.0–2.5)
ALT: 28 U/L (ref 6–29)
AST: 18 U/L (ref 10–30)
Albumin: 4 g/dL (ref 3.6–5.1)
Alkaline phosphatase (APISO): 78 U/L (ref 31–125)
BUN: 12 mg/dL (ref 7–25)
CO2: 29 mmol/L (ref 20–32)
Calcium: 9.5 mg/dL (ref 8.6–10.2)
Chloride: 104 mmol/L (ref 98–110)
Creat: 0.91 mg/dL (ref 0.50–1.10)
GFR, Est African American: 90 mL/min/{1.73_m2} (ref >=60)
GFR, Est Non African American: 77 mL/min/{1.73_m2} (ref >=60)
Globulin: 2.8 g/dL (calc) (ref 1.9–3.7)
Glucose, Bld: 95 mg/dL (ref 65–99)
Potassium: 4.3 mmol/L (ref 3.5–5.3)
Sodium: 139 mmol/L (ref 135–146)
Total Bilirubin: 0.5 mg/dL (ref 0.2–1.2)
Total Protein: 6.8 g/dL (ref 6.1–8.1)

## 2019-07-19 LAB — CBC
HCT: 40.7 % (ref 35.0–45.0)
Hemoglobin: 13.9 g/dL (ref 11.7–15.5)
MCH: 28.5 pg (ref 27.0–33.0)
MCHC: 34.2 g/dL (ref 32.0–36.0)
MCV: 83.4 fL (ref 80.0–100.0)
MPV: 9.7 fL (ref 7.5–12.5)
Platelets: 388 10*3/uL (ref 140–400)
RBC: 4.88 10*6/uL (ref 3.80–5.10)
RDW: 12.5 % (ref 11.0–15.0)
WBC: 6.7 10*3/uL (ref 3.8–10.8)

## 2019-08-08 ENCOUNTER — Other Ambulatory Visit: Payer: Self-pay | Admitting: Obstetrics & Gynecology

## 2019-08-08 ENCOUNTER — Telehealth: Payer: Self-pay

## 2019-08-08 NOTE — Telephone Encounter (Signed)
I called to check with patient because I received refill request from pharmacy for her bcp 90 days supply.  She is having IUD inserted 08/15/19.  Patient said she did not want to refill it and did not request it.  In the conversation she mentioned she took her last pill this past Sunday and would normally restart the Sunday before IUD inserted Monday. I advised her to abstain/or strict condom use between now and IUD insertion.

## 2019-08-13 ENCOUNTER — Ambulatory Visit (INDEPENDENT_AMBULATORY_CARE_PROVIDER_SITE_OTHER): Payer: BC Managed Care – PPO | Admitting: Obstetrics & Gynecology

## 2019-08-13 ENCOUNTER — Other Ambulatory Visit: Payer: Self-pay

## 2019-08-13 ENCOUNTER — Encounter: Payer: Self-pay | Admitting: Obstetrics & Gynecology

## 2019-08-13 VITALS — BP 126/84

## 2019-08-13 DIAGNOSIS — Z30430 Encounter for insertion of intrauterine contraceptive device: Secondary | ICD-10-CM

## 2019-08-13 NOTE — Progress Notes (Signed)
    [name] November 30, 1975 [id number]        44 y.o.  G2P2L2 Married   RP: Mirena IUD insertion  HPI: On BCPs, desires to change to [place] IUD.  Stopped the active pills of her BCPs 6 days ago, had a light period.  No pelvic pain.  Normal vaginal secretions.   OB History  Gravida Para Term Preterm AB Living  2 2       2   SAB TAB Ectopic Multiple Live Births               # Outcome Date GA Lbr Len/2nd Weight Sex Delivery Anes PTL Lv  2 Para           1 Para             Past medical history,surgical history, problem list, medications, allergies, family history and social history were all reviewed and documented in the EPIC chart.   Directed ROS with pertinent positives and negatives documented in the history of present illness/assessment and plan.  Exam:  Vitals:   08/13/19 1520  BP: 126/84   General appearance:  Normal                                                                    IUD procedure note       Patient presented to the office today for placement of Mirena IUD. The patient had previously been provided with literature information on this method of contraception. The risks benefits and pros and cons were discussed and all her questions were answered. She is fully aware that this form of contraception is 99% effective and is good for 5 years.  Pelvic exam: Vulva normal Vagina: No lesions or discharge Cervix: No lesions or discharge Uterus: AV position Adnexa: No masses or tenderness Rectal exam: Not done  The cervix was cleansed with Betadine solution. Hurricane spray on the cervix.  A single-tooth tenaculum was placed on the anterior cervical lip.  Os finder hysterometry 8 cm. The IUD was shown to the patient and inserted in a sterile fashion.  The IUD string was trimmed. The single-tooth tenaculum was removed. Patient was instructed to return back to the office in one month for follow up.        Assessment/Plan:  44 y.o. G2P2   1. Encounter for  IUD insertion Decision to change from birth control pills to the [place] IUD.  Counseling on Mirena IUD contraception done.  Insertion procedure of Mirena IUD explained.  Mirena IUD inserted without difficulty or complication, well-tolerated by patient.  Postprocedure counseling done.  Follow-up 4 weeks for [facility] IUD check.  Princess Bruins MD, 3:31 PM 08/13/2019

## 2019-08-13 NOTE — Patient Instructions (Signed)
1. Encounter for IUD insertion Decision to change from birth control pills to the [place] IUD.  Counseling on Mirena IUD contraception done.  Insertion procedure of Mirena IUD explained.  Mirena IUD inserted without difficulty or complication, well-tolerated by patient.  Postprocedure counseling done.  Follow-up 4 weeks for [facility] IUD check.  [name], it was a pleasure seeing you today!

## 2019-08-16 DIAGNOSIS — L209 Atopic dermatitis, unspecified: Secondary | ICD-10-CM | POA: Diagnosis not present

## 2019-08-16 DIAGNOSIS — L710 Perioral dermatitis: Secondary | ICD-10-CM | POA: Diagnosis not present

## 2019-08-16 DIAGNOSIS — L700 Acne vulgaris: Secondary | ICD-10-CM | POA: Diagnosis not present

## 2019-08-21 ENCOUNTER — Other Ambulatory Visit: Payer: Self-pay | Admitting: Obstetrics & Gynecology

## 2019-08-23 DIAGNOSIS — Z135 Encounter for screening for eye and ear disorders: Secondary | ICD-10-CM | POA: Diagnosis not present

## 2019-08-23 DIAGNOSIS — H5213 Myopia, bilateral: Secondary | ICD-10-CM | POA: Diagnosis not present

## 2019-08-24 ENCOUNTER — Other Ambulatory Visit: Payer: Self-pay

## 2019-08-24 ENCOUNTER — Ambulatory Visit
Admission: RE | Admit: 2019-08-24 | Discharge: 2019-08-24 | Disposition: A | Discharge status: Home / Self Care | Payer: BC Managed Care – PPO | Source: Ambulatory Visit | Attending: Obstetrics & Gynecology | Admitting: Obstetrics & Gynecology

## 2019-08-24 DIAGNOSIS — Z1231 Encounter for screening mammogram for malignant neoplasm of breast: Secondary | ICD-10-CM

## 2019-09-12 ENCOUNTER — Ambulatory Visit (INDEPENDENT_AMBULATORY_CARE_PROVIDER_SITE_OTHER): Payer: BC Managed Care – PPO | Admitting: Obstetrics & Gynecology

## 2019-09-12 ENCOUNTER — Other Ambulatory Visit: Payer: Self-pay

## 2019-09-12 ENCOUNTER — Encounter: Payer: Self-pay | Admitting: Obstetrics & Gynecology

## 2019-09-12 VITALS — BP 118/80

## 2019-09-12 DIAGNOSIS — Z5329 Procedure and treatment not carried out because of patient's decision for other reasons: Secondary | ICD-10-CM

## 2019-09-25 ENCOUNTER — Encounter: Payer: Self-pay | Admitting: Anesthesiology

## 2019-10-31 ENCOUNTER — Encounter: Payer: Self-pay | Admitting: Obstetrics & Gynecology

## 2019-10-31 ENCOUNTER — Ambulatory Visit (INDEPENDENT_AMBULATORY_CARE_PROVIDER_SITE_OTHER): Payer: BC Managed Care – PPO | Admitting: Obstetrics & Gynecology

## 2019-10-31 ENCOUNTER — Other Ambulatory Visit: Payer: Self-pay

## 2019-10-31 VITALS — BP 126/82

## 2019-10-31 DIAGNOSIS — Z975 Presence of (intrauterine) contraceptive device: Secondary | ICD-10-CM | POA: Diagnosis not present

## 2019-10-31 DIAGNOSIS — N921 Excessive and frequent menstruation with irregular cycle: Secondary | ICD-10-CM | POA: Diagnosis not present

## 2019-10-31 DIAGNOSIS — Z30431 Encounter for routine checking of intrauterine contraceptive device: Secondary | ICD-10-CM

## 2019-10-31 MED ORDER — NORETHINDRONE 0.35 MG PO TABS: 1.0000 | ORAL_TABLET | Freq: Every day | ORAL | 0 refills | Status: DC

## 2019-10-31 NOTE — Progress Notes (Signed)
    [name] September 19, 1975 [id number]        44 y.o.  G2P2L2 Married  RP: Mirena IUD inserted 08/13/2019 for IUD check  HPI: BTB on Mirena IUD x 08/13/2019.  Very light brown spotting currently.  Had a few days without bleeding last week.  No pelvic pain.  No vaginal d/c.  No fever.   OB History  Gravida Para Term Preterm AB Living  2 2       2   SAB TAB Ectopic Multiple Live Births               # Outcome Date GA Lbr Len/2nd Weight Sex Delivery Anes PTL Lv  2 Para           1 Para             Past medical history,surgical history, problem list, medications, allergies, family history and social history were all reviewed and documented in the EPIC chart.   Directed ROS with pertinent positives and negatives documented in the history of present illness/assessment and plan.  Exam:  Vitals:   10/31/19 1454  BP: 126/82   General appearance:  Normal  Abdomen: Normal  Gynecologic exam: Vulva normal.  Speculum:  Cervix normal.  IUD strings short, but visible at the EO.  Minimal spotting.  Vagina normal.  Bimanual exam:  IUD strings felt at EO.  NT.   Assessment/Plan:  44 y.o. G2P2   1. Encounter for routine checking of intrauterine contraceptive device (IUD) Mirena IUD inserted August 13, 2019.  Well-tolerated but frequent breakthrough bleeding.  IUD in good position with no sign of infection.  Patient reassured.  Decision to send a prescription for the progestin only pill to start if continued breakthrough bleeding after 2-3 more weeks.  Usage known.  Prescription sent to pharmacy.  2. Breakthrough bleeding with IUD Mild but continue nude breakthrough bleeding 2-1/2 months after Mirena IUD insertion.  Will attempt control with the progestin only pill if still a problem in 2 to 3 weeks.  Other orders - norethindrone (CAMILA) 0.35 MG tablet; Take 1 tablet (0.35 mg total) by mouth daily.  Princess Bruins MD, 3:05 PM 10/31/2019

## 2020-01-20 ENCOUNTER — Other Ambulatory Visit: Payer: Self-pay | Admitting: Obstetrics & Gynecology

## 2020-03-05 DIAGNOSIS — L700 Acne vulgaris: Secondary | ICD-10-CM | POA: Diagnosis not present

## 2020-04-29 ENCOUNTER — Other Ambulatory Visit: Payer: Self-pay | Admitting: Obstetrics & Gynecology

## 2020-04-29 DIAGNOSIS — Z1231 Encounter for screening mammogram for malignant neoplasm of breast: Secondary | ICD-10-CM

## 2020-06-03 DIAGNOSIS — Z5181 Encounter for therapeutic drug level monitoring: Secondary | ICD-10-CM | POA: Diagnosis not present

## 2020-06-03 DIAGNOSIS — L700 Acne vulgaris: Secondary | ICD-10-CM | POA: Diagnosis not present

## 2020-07-07 ENCOUNTER — Encounter: Payer: BC Managed Care – PPO | Admitting: Obstetrics & Gynecology

## 2020-07-18 ENCOUNTER — Encounter: Payer: BC Managed Care – PPO | Admitting: Obstetrics & Gynecology

## 2020-07-18 ENCOUNTER — Ambulatory Visit (INDEPENDENT_AMBULATORY_CARE_PROVIDER_SITE_OTHER): Payer: BC Managed Care – PPO | Admitting: Obstetrics & Gynecology

## 2020-07-18 ENCOUNTER — Other Ambulatory Visit: Payer: Self-pay

## 2020-07-18 ENCOUNTER — Encounter: Payer: Self-pay | Admitting: Obstetrics & Gynecology

## 2020-07-18 VITALS — BP 126/82 | Ht 63.0 in | Wt 208.0 lb

## 2020-07-18 DIAGNOSIS — E6609 Other obesity due to excess calories: Secondary | ICD-10-CM

## 2020-07-18 DIAGNOSIS — Z01419 Encounter for gynecological examination (general) (routine) without abnormal findings: Secondary | ICD-10-CM

## 2020-07-18 DIAGNOSIS — Z975 Presence of (intrauterine) contraceptive device: Secondary | ICD-10-CM | POA: Diagnosis not present

## 2020-07-18 DIAGNOSIS — Z6836 Body mass index (BMI) 36.0-36.9, adult: Secondary | ICD-10-CM | POA: Diagnosis not present

## 2020-07-18 NOTE — Progress Notes (Signed)
[name] [birth year] [id number]   History:    45 y.o. G2P2L2 Married. Son is 91, daughter is 66 yo.  VZ:[id number] presenting for annual gyn exam   YSA:YTKZ on the new Mirena IUD x 07/2019.  No BTB. No pelvic pain. No pain with intercourse. Urine and bowel movements normal. Breasts normal. Last mammogram June 2021 was negative. Body mass index increased to 36.85. Health labs with family physician.   Past medical history,surgical history, family history and social history were all reviewed and documented in the EPIC chart.  Gynecologic History No LMP recorded. (Menstrual status: IUD).  Obstetric History OB History  Gravida Para Term Preterm AB Living  2 2       2   SAB IAB Ectopic Multiple Live Births               # Outcome Date GA Lbr Len/2nd Weight Sex Delivery Anes PTL Lv  2 Para           1 Para              ROS: A ROS was performed and pertinent positives and negatives are included in the history.  GENERAL: No fevers or chills. HEENT: No change in vision, no earache, sore throat or sinus congestion. NECK: No pain or stiffness. CARDIOVASCULAR: No chest pain or pressure. No palpitations. PULMONARY: No shortness of breath, cough or wheeze. GASTROINTESTINAL: No abdominal pain, nausea, vomiting or diarrhea, melena or bright red blood per rectum. GENITOURINARY: No urinary frequency, urgency, hesitancy or dysuria. MUSCULOSKELETAL: No joint or muscle pain, no back pain, no recent trauma. DERMATOLOGIC: No rash, no itching, no lesions. ENDOCRINE: No polyuria, polydipsia, no heat or cold intolerance. No recent change in weight. HEMATOLOGICAL: No anemia or easy bruising or bleeding. NEUROLOGIC: No headache, seizures, numbness, tingling or weakness. PSYCHIATRIC: No depression, no loss of interest in normal activity or change in sleep pattern.     Exam:   BP 126/82   Ht 5\' 3"  (1.6 m)   Wt 208 lb (94.3 kg)   BMI 36.85 kg/m   Body mass index is 36.85  kg/m.  General appearance : Well developed well nourished female. No acute distress HEENT: Eyes: no retinal hemorrhage or exudates,  Neck supple, trachea midline, no carotid bruits, no thyroidmegaly Lungs: Clear to auscultation, no rhonchi or wheezes, or rib retractions  Heart: Regular rate and rhythm, no murmurs or gallops Breast:Examined in sitting and supine position were symmetrical in appearance, no palpable masses or tenderness,  no skin retraction, no nipple inversion, no nipple discharge, no skin discoloration, no axillary or supraclavicular lymphadenopathy Abdomen: no palpable masses or tenderness, no rebound or guarding Extremities: no edema or skin discoloration or tenderness  Pelvic: Vulva: Normal             Vagina: No gross lesions or discharge  Cervix: No gross lesions or discharge.  IUD string felt at [facility].  Uterus  AV, normal size, shape and consistency, non-tender and mobile  Adnexa  Without masses or tenderness  Anus: Normal   Assessment/Plan:  45 y.o. female for annual exam   1. Well female exam with routine gynecological exam Normal gynecologic exam.  Pap test May 2021 was negative, no indication to repeat this year.  Breast exam normal.  Screening mammogram June 2021 was negative.  Health labs with family physician.  2. Uses hormone releasing intrauterine device (IUD) for contraception Well on new Mirena IUD since June 2021.  No breakthrough bleeding.  IUD  in good location.  3. Class 2 obesity due to excess calories without serious comorbidity with body mass index (BMI) of 36.0 to 36.9 in adult Patient is on phentermine and doing a low calorie/low carb diet with intermittent fasting.  Increasing aerobic activities, recommend 5 times a week.  Light weightlifting every 2 days.  Other orders - phentermine 37.5 MG capsule; Take 37.5 mg by mouth every morning.  Princess Bruins MD, 1:28 PM 07/18/2020

## 2020-07-23 ENCOUNTER — Encounter: Payer: BC Managed Care – PPO | Admitting: Osteopathic Medicine

## 2020-08-07 ENCOUNTER — Encounter: Payer: Self-pay | Admitting: Osteopathic Medicine

## 2020-08-07 ENCOUNTER — Ambulatory Visit (INDEPENDENT_AMBULATORY_CARE_PROVIDER_SITE_OTHER): Payer: BC Managed Care – PPO | Admitting: Osteopathic Medicine

## 2020-08-07 ENCOUNTER — Other Ambulatory Visit: Payer: Self-pay

## 2020-08-07 VITALS — BP 102/64 | HR 74 | Temp 98.8°F | Wt 211.1 lb

## 2020-08-07 DIAGNOSIS — Z23 Encounter for immunization: Secondary | ICD-10-CM

## 2020-08-07 DIAGNOSIS — R7301 Impaired fasting glucose: Secondary | ICD-10-CM | POA: Diagnosis not present

## 2020-08-07 DIAGNOSIS — Z0000 Encounter for general adult medical examination without abnormal findings: Secondary | ICD-10-CM

## 2020-08-07 NOTE — Progress Notes (Signed)
[name] is a 45 y.o. female who presents to  [place] at [facility]  today, 08/07/20, seeking care for the following:  Annual physical      ASSESSMENT & PLAN with other pertinent findings:  The encounter diagnosis was Annual physical exam.    Patient Instructions  General Preventive Care Most recent routine screening labs: ordered today.  Blood pressure goal 130/80 or less.  Tobacco: don't!  Alcohol: responsible moderation is ok for most adults - if you have concerns about your alcohol intake, please talk to me!  Exercise: as tolerated to reduce risk of cardiovascular disease and diabetes. Strength training will also prevent osteoporosis.  Mental health: if need for mental health care (medicines, counseling, other), or concerns about moods, please let me know!  Sexual / Reproductive health: if need for STD testing, if concerns with libido/pain problems, if need to discuss family planning, please let me or OBGYN know!  Advanced Directive: Living Will and/or Healthcare Power of Attorney recommended for all adults, regardless of age or health.  Vaccines Flu vaccine: for almost everyone, every fall.  Shingles vaccine: after age 59.  Pneumonia vaccines: after age 33. Tetanus booster: every 10 years / 3rd trimester of pregnancy. Our records show you are due!  COVID vaccine: THANKS for getting your vaccine! :) BOOSTER STRONGLY RECOMMENDED  Cancer screenings  Colon cancer screening: for everyone age 57-75. Colonoscopy available for all, many people also qualify for the Cologuard stool test IF no family history of colon cancer and no personal history of abnormal colonoscopy and/or recent bloody stools.  Breast cancer screening: keep scheduled mammogram appointment  Cervical cancer screening: Pap per OBGYN Lung cancer screening: not needed for non-smokers  Infection screenings  HIV: recommended screening at least once age  74-65. Gonorrhea/Chlamydia: screening as needed Hepatitis C: recommended once for everyone age 17-51 TB: certain at-risk populations, or depending on work requirements and/or travel history Other Bone Density Test: recommended for women at age 53  Orders Placed This Encounter  Procedures   CBC   COMPLETE METABOLIC PANEL WITH GFR   Lipid panel    No orders of the defined types were placed in this encounter.    See below for relevant physical exam findings  See below for recent lab and imaging results reviewed  Medications, allergies, PMH, PSH, SocH, [place] reviewed below    Follow-up instructions: Return in about 1 year (around 08/07/2021) for [place] (call week prior to visit for lab orders).                                        Exam:  There were no vitals taken for this visit. Constitutional: VS see above. General Appearance: alert, well-developed, well-nourished, NAD Neck: No masses, trachea midline.  Respiratory: Normal respiratory effort. no wheeze, no rhonchi, no rales Cardiovascular: S1/S2 normal, no murmur, no rub/gallop auscultated. RRR.  Musculoskeletal: Gait normal. Symmetric and independent movement of all extremities Abdominal: non-tender, non-distended, no appreciable organomegaly, neg Murphy's, BS WNLx4 Neurological: Normal balance/coordination. No tremor. Skin: warm, dry, intact.  Psychiatric: Normal judgment/insight. Normal mood and affect. Oriented x3.   No outpatient medications have been marked as taking for the 08/07/20 encounter (Office Visit) with Emeterio Reeve, DO.    Allergies  Allergen Reactions   Hydroquinone Rash    There are no problems to display for this patient.  Family History  Problem Relation Age of Onset   Diabetes Maternal Grandmother    Diabetes Maternal Grandfather     Social History   Tobacco Use  Smoking Status Never  Smokeless Tobacco Never    Past Surgical History:   Procedure Laterality Date   CESAREAN SECTION  2007   CESAREAN SECTION  2012    Immunization History  Administered Date(s) Administered   Influenza-Unspecified 12/02/2015   Moderna Sars-Covid-2 Vaccination 04/09/2019, 05/09/2019   Tdap 05/21/2010    No results found for this or any previous visit (from the past 2160 hour(s)).  No results found.     All questions at time of visit were answered - patient instructed to contact office with any additional concerns or updates. ER/RTC precautions were reviewed with the patient as applicable.   Please note: manual typing as well as voice recognition software may have been used to produce this document - typos may escape review. Please contact Dr. Sheppard Coil for any needed clarifications.

## 2020-08-07 NOTE — Patient Instructions (Addendum)
General Preventive Care Most recent routine screening labs: ordered today.  Blood pressure goal 130/80 or less.  Tobacco: don't!  Alcohol: responsible moderation is ok for most adults - if you have concerns about your alcohol intake, please talk to me!  Exercise: as tolerated to reduce risk of cardiovascular disease and diabetes. Strength training will also prevent osteoporosis.  Mental health: if need for mental health care (medicines, counseling, other), or concerns about moods, please let me know!  Sexual / Reproductive health: if need for STD testing, if concerns with libido/pain problems, if need to discuss family planning, please let me or OBGYN know!  Advanced Directive: Living Will and/or Healthcare Power of Attorney recommended for all adults, regardless of age or health.  Vaccines Flu vaccine: for almost everyone, every fall.  Shingles vaccine: after age 67.  Pneumonia vaccines: after age 76. Tetanus booster: every 10 years / 3rd trimester of pregnancy. Our records show you are due!  COVID vaccine: THANKS for getting your vaccine! :) BOOSTER STRONGLY RECOMMENDED  Cancer screenings  Colon cancer screening: for everyone age 73-75. Colonoscopy available for all, many people also qualify for the Cologuard stool test IF no family history of colon cancer and no personal history of abnormal colonoscopy and/or recent bloody stools.  Breast cancer screening: keep scheduled mammogram appointment  Cervical cancer screening: Pap per OBGYN Lung cancer screening: not needed for non-smokers  Infection screenings  HIV: recommended screening at least once age 2-65. Gonorrhea/Chlamydia: screening as needed Hepatitis C: recommended once for everyone age 03-49 TB: certain at-risk populations, or depending on work requirements and/or travel history Other Bone Density Test: recommended for women at age 44

## 2020-08-11 LAB — COMPLETE METABOLIC PANEL WITH GFR
AG Ratio: 1.5 (calc) (ref 1.0–2.5)
ALT: 13 U/L (ref 6–29)
AST: 11 U/L (ref 10–30)
Albumin: 4.2 g/dL (ref 3.6–5.1)
Alkaline phosphatase (APISO): 88 U/L (ref 31–125)
BUN: 10 mg/dL (ref 7–25)
CO2: 28 mmol/L (ref 20–32)
Calcium: 9.3 mg/dL (ref 8.6–10.2)
Chloride: 102 mmol/L (ref 98–110)
Creat: 0.72 mg/dL (ref 0.50–1.10)
GFR, Est African American: 118 mL/min/{1.73_m2} (ref >=60)
GFR, Est Non African American: 102 mL/min/{1.73_m2} (ref >=60)
Globulin: 2.8 g/dL (calc) (ref 1.9–3.7)
Glucose, Bld: 102 mg/dL — ABNORMAL HIGH (ref 65–99)
Potassium: 4.4 mmol/L (ref 3.5–5.3)
Sodium: 138 mmol/L (ref 135–146)
Total Bilirubin: 0.6 mg/dL (ref 0.2–1.2)
Total Protein: 7 g/dL (ref 6.1–8.1)

## 2020-08-11 LAB — CBC
HCT: 41.8 % (ref 35.0–45.0)
Hemoglobin: 13.8 g/dL (ref 11.7–15.5)
MCH: 27.5 pg (ref 27.0–33.0)
MCHC: 33 g/dL (ref 32.0–36.0)
MCV: 83.3 fL (ref 80.0–100.0)
MPV: 10 fL (ref 7.5–12.5)
Platelets: 392 10*3/uL (ref 140–400)
RBC: 5.02 10*6/uL (ref 3.80–5.10)
RDW: 13.2 % (ref 11.0–15.0)
WBC: 8.9 10*3/uL (ref 3.8–10.8)

## 2020-08-11 LAB — LIPID PANEL
Cholesterol: 190 mg/dL (ref <200)
HDL: 65 mg/dL (ref >=50)
LDL Cholesterol (Calc): 111 mg/dL (calc) — ABNORMAL HIGH
Non-HDL Cholesterol (Calc): 125 mg/dL (calc) (ref <130)
Total CHOL/HDL Ratio: 2.9 (calc) (ref <5.0)
Triglycerides: 57 mg/dL (ref <150)

## 2020-08-11 LAB — HEMOGLOBIN A1C W/OUT EAG: Hgb A1c MFr Bld: 5.5 % of total Hgb (ref <5.7)

## 2020-08-26 ENCOUNTER — Other Ambulatory Visit: Payer: Self-pay

## 2020-08-26 ENCOUNTER — Ambulatory Visit
Admission: RE | Admit: 2020-08-26 | Discharge: 2020-08-26 | Disposition: A | Discharge status: Home / Self Care | Payer: BC Managed Care – PPO | Source: Ambulatory Visit | Attending: Obstetrics & Gynecology | Admitting: Obstetrics & Gynecology

## 2020-08-26 DIAGNOSIS — Z1231 Encounter for screening mammogram for malignant neoplasm of breast: Secondary | ICD-10-CM | POA: Diagnosis not present

## 2020-10-02 IMAGING — MG DIGITAL SCREENING BILAT W/ TOMO W/ CAD
8 series · 8 of 24 positions shown · non-contrast
Comparison: Previous exam(s).

CLINICAL DATA: Screening.

EXAM:
DIGITAL SCREENING BILATERAL MAMMOGRAM WITH TOMO AND CAD

[R CC synth-2D]
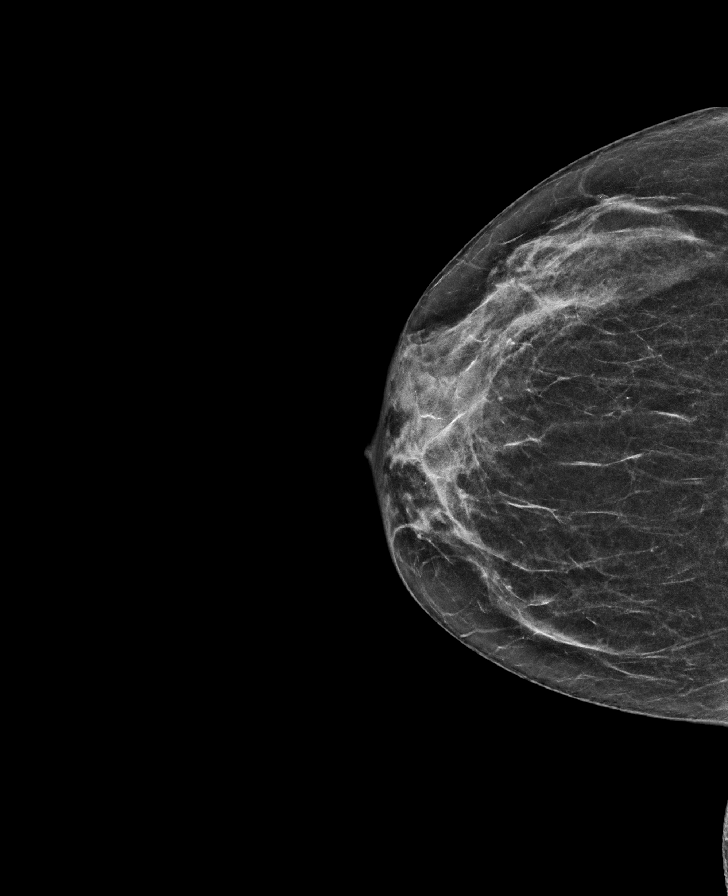

[L CC synth-2D]
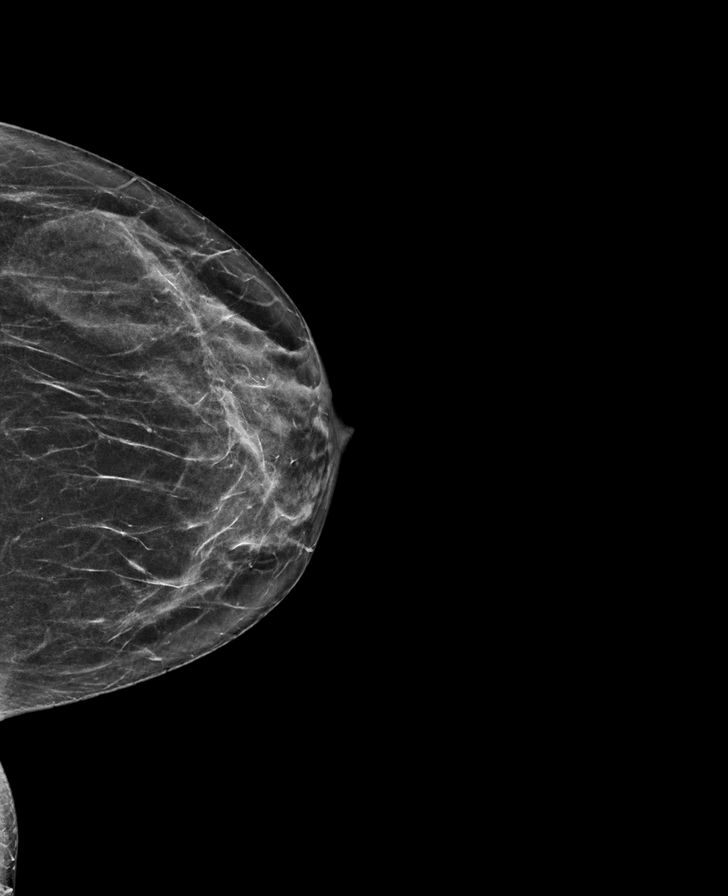

[R MLO synth-2D]
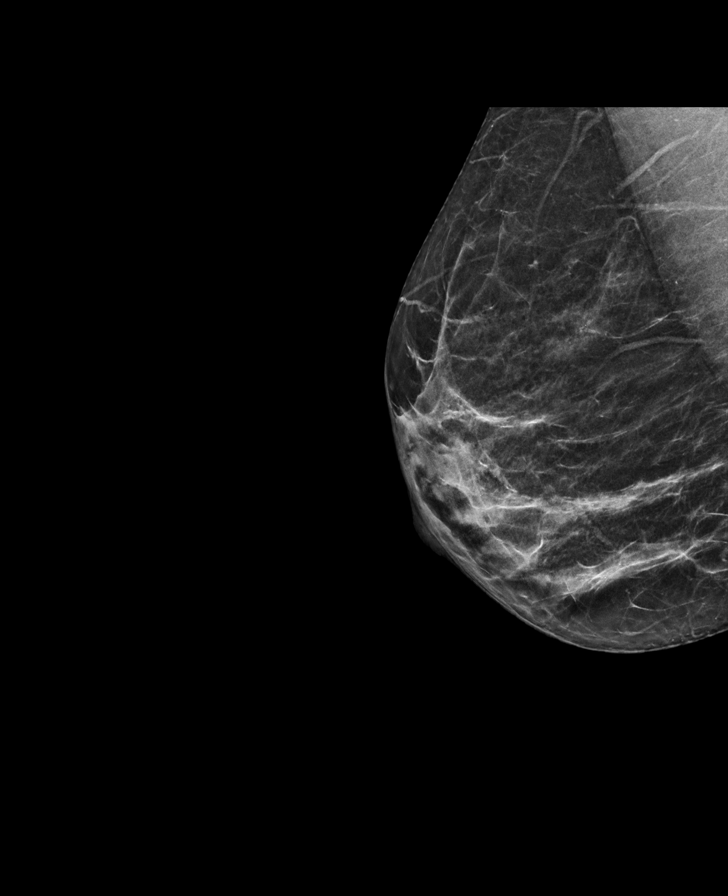

[L MLO synth-2D]
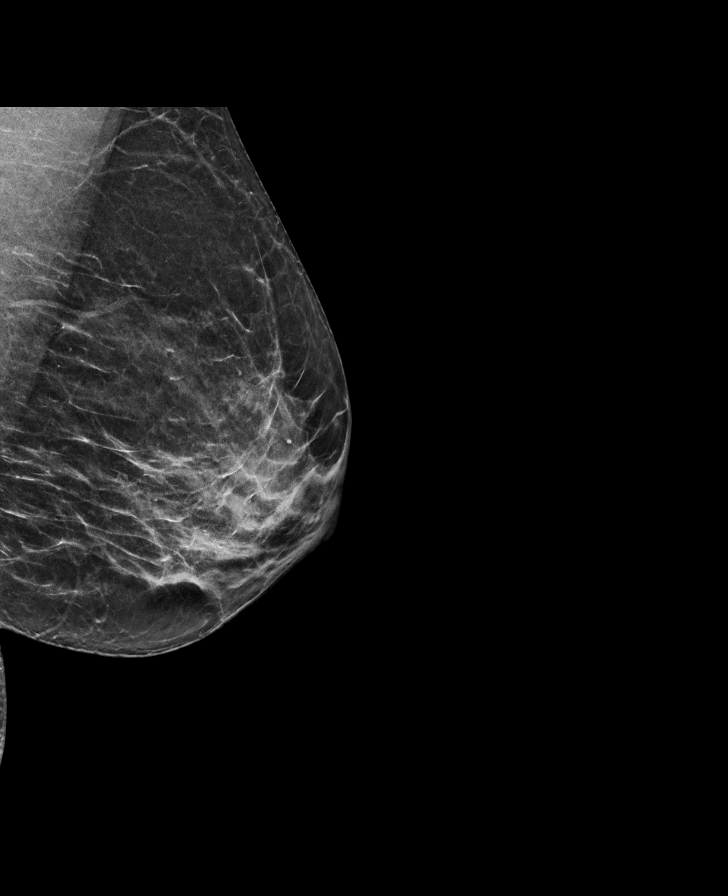

[R MLO tomo · tomo slice 29/58.0]
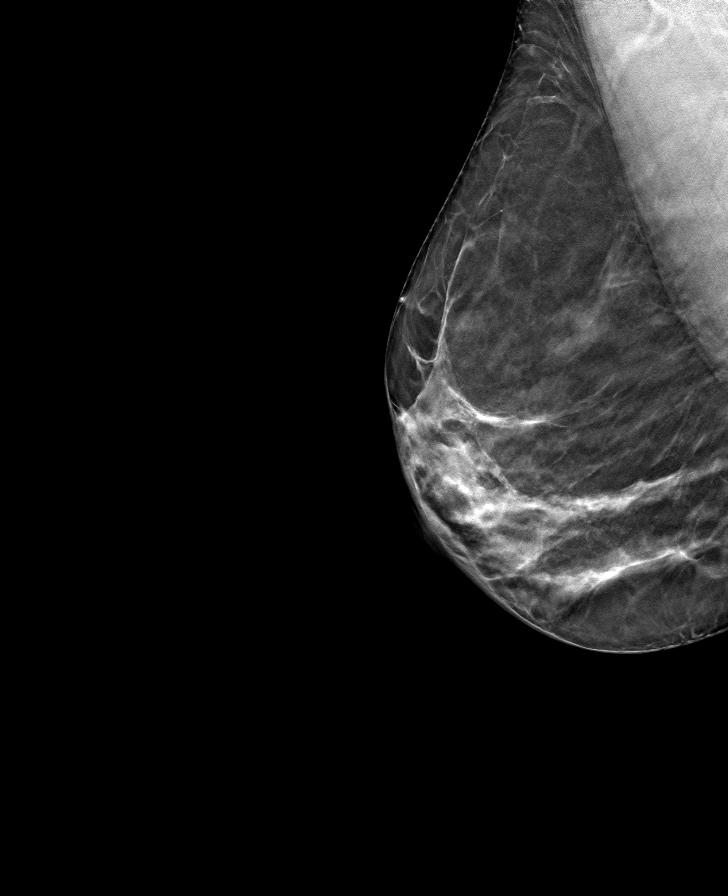

[L CC tomo · tomo slice 29/57.0]
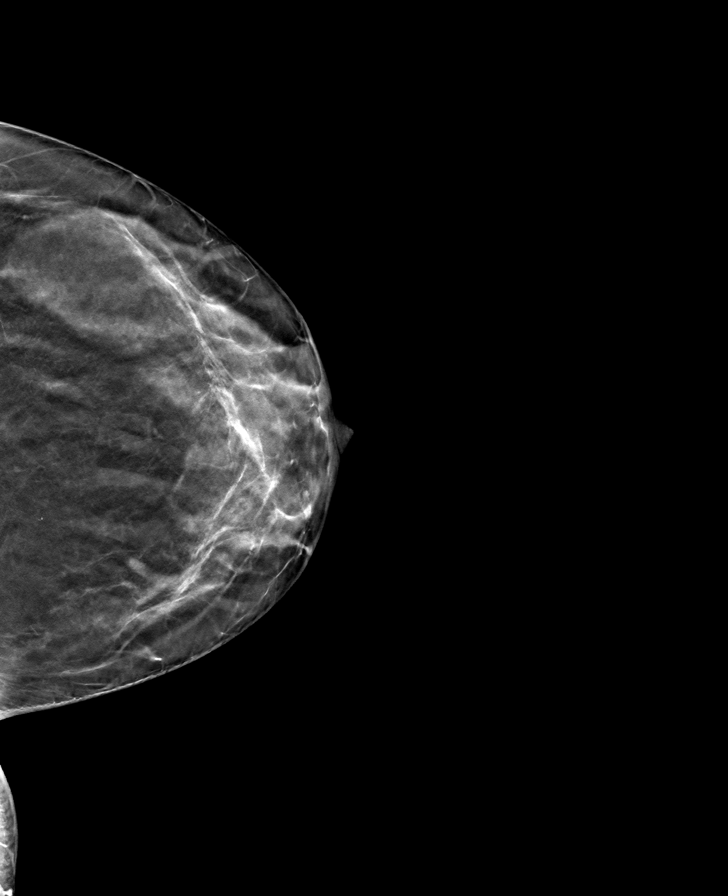

[L MLO tomo · tomo slice 29/58.0]
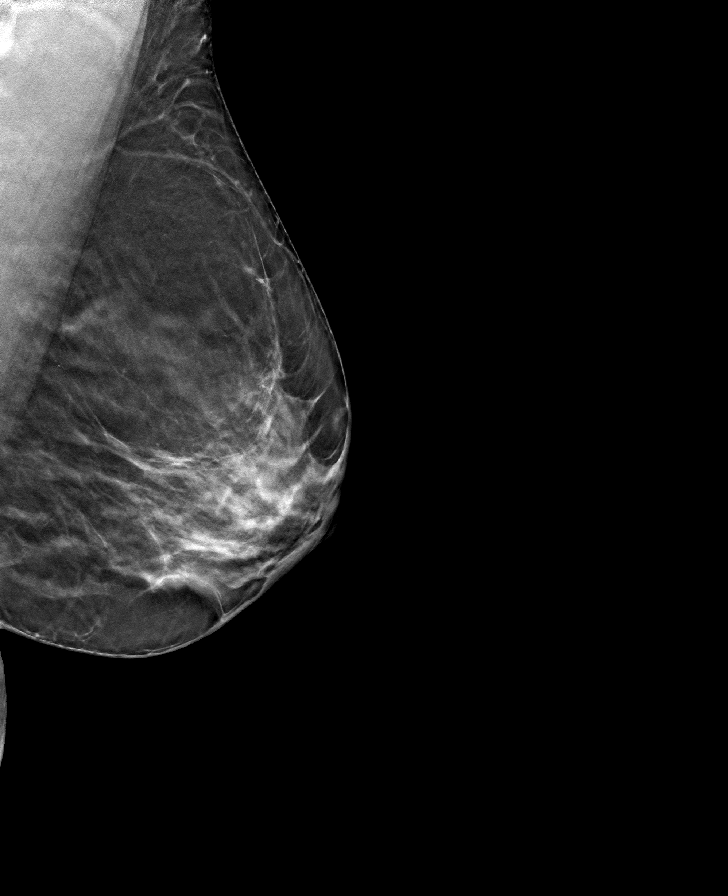

[R CC tomo · tomo slice 27/53.0]
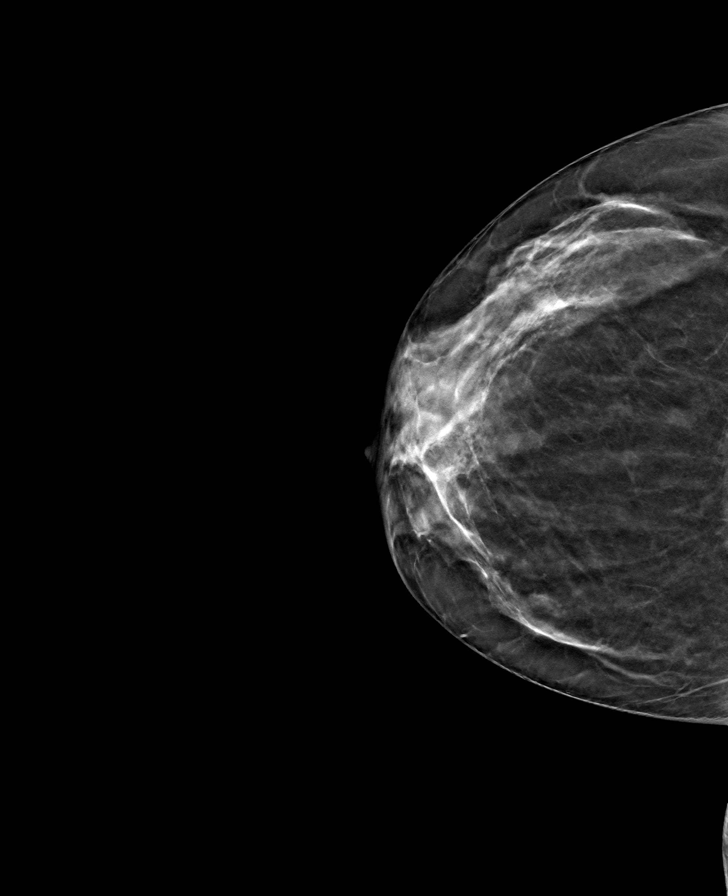

[8 of 24 positions shown; findings below may reference images not displayed]

ACR Breast Density Category c: The breast tissue is heterogeneously
dense, which may obscure small masses.
FINDINGS: There are no findings suspicious for malignancy. Images were
processed with CAD.
IMPRESSION: No mammographic evidence of malignancy. A result letter of this
screening mammogram will be mailed directly to the patient.

RECOMMENDATION:
Screening mammogram in one year. (Code:[id number]-[id number])

BI-RADS CATEGORY  1: Negative.

## 2020-12-01 DIAGNOSIS — H52223 Regular astigmatism, bilateral: Secondary | ICD-10-CM | POA: Diagnosis not present

## 2020-12-01 DIAGNOSIS — H5213 Myopia, bilateral: Secondary | ICD-10-CM | POA: Diagnosis not present

## 2020-12-01 DIAGNOSIS — Z135 Encounter for screening for eye and ear disorders: Secondary | ICD-10-CM | POA: Diagnosis not present

## 2021-01-02 DIAGNOSIS — N951 Menopausal and female climacteric states: Secondary | ICD-10-CM | POA: Diagnosis not present

## 2021-01-02 DIAGNOSIS — R635 Abnormal weight gain: Secondary | ICD-10-CM | POA: Diagnosis not present

## 2021-04-13 ENCOUNTER — Ambulatory Visit: Payer: BC Managed Care – PPO | Admitting: Medical-Surgical

## 2021-04-13 ENCOUNTER — Encounter: Payer: Self-pay | Admitting: Medical-Surgical

## 2021-04-13 ENCOUNTER — Other Ambulatory Visit: Payer: Self-pay | Admitting: Medical-Surgical

## 2021-04-13 ENCOUNTER — Other Ambulatory Visit: Payer: Self-pay

## 2021-04-13 VITALS — BP 99/64 | HR 86 | Resp 20 | Ht 63.0 in | Wt 195.3 lb

## 2021-04-13 DIAGNOSIS — R634 Abnormal weight loss: Secondary | ICD-10-CM

## 2021-04-13 MED ORDER — OZEMPIC (0.25 OR 0.5 MG/DOSE) 2 MG/1.5ML ~~LOC~~ SOPN: 0.5000 mg | PEN_INJECTOR | SUBCUTANEOUS | 1 refills | Status: DC

## 2021-04-13 MED ORDER — OZEMPIC (0.25 OR 0.5 MG/DOSE) 2 MG/1.5ML ~~LOC~~ SOPN: 0.2500 mg | PEN_INJECTOR | SUBCUTANEOUS | 1 refills | Status: DC

## 2021-04-13 NOTE — Progress Notes (Signed)
°  HPI with pertinent ROS:   CC: Discussed weight loss  HPI: Very pleasant 46 year old female presenting today to discuss weight loss options.  She has been to a weight loss clinic and had quite a bit of education regarding weight loss options including diet modifications and exercise recommendations.  She was originally told about Ozempic at the weight loss clinic however they quoted her a cost of about $600 and she reports that that was financially unfeasible.  She presents today wondering if there is a way to get that covered by insurance through her PCP.  She does have a history of being prediabetic.  She did change her habits in November and has lost approximately 22 pounds from diet changes alone.  Eating similar to keto but this does change from week to week.  Her meals consist of protein and vegetables most often but every other week she does add fruits.  For breakfast, she has eggs and bacon.  Lunch is usually tuna packets or leftovers from dinner the night before.  She eats peanuts or mixed nuts for snacks.  Dinner is usually a home-cooked meal.  Her goal weight is 140 pounds.  She started exercising 2 weeks ago when she got a membership at [employer]. She has been going with her daughter walking on Mondays, Wednesdays, and 1 day during the week.  I reviewed the past medical history, family history, social history, surgical history, and allergies today and no changes were needed.  Please see the problem list section below in epic for further details.   Physical exam:   General: Well Developed, well nourished, and in no acute distress.  Neuro: Alert and oriented x3.  HEENT: Normocephalic, atraumatic.  Skin: Warm and dry. Cardiac: Regular rate and rhythm, no murmurs rubs or gallops, no lower extremity edema.  Respiratory: Clear to auscultation bilaterally. Not using accessory muscles, speaking in full sentences.  Impression and Recommendations:    1. Weight loss Sending in Ozempic 0.25 mg  weekly per patient request to see if insurance will cover this.  Sounds like she is doing all the right things with her dietary habits as well as regular exercise.  Would like to see her to do some weight training to help build muscle and change her body composition.  Advised that this will likely help burn calories faster.  If Ozempic is not approved, she may need to consider other options to help with weight loss.  Return for weight check in 4-6 weeks. ___________________________________________ Clearnce Sorrel, DNP, APRN, FNP-BC Primary Care [facility] Medicine [place]

## 2021-04-18 ENCOUNTER — Encounter: Payer: Self-pay | Admitting: Medical-Surgical

## 2021-04-20 ENCOUNTER — Telehealth: Payer: Self-pay

## 2021-04-20 ENCOUNTER — Telehealth: Payer: Self-pay | Admitting: *Deleted

## 2021-04-20 DIAGNOSIS — Z1211 Encounter for screening for malignant neoplasm of colon: Secondary | ICD-10-CM

## 2021-04-20 NOTE — Telephone Encounter (Signed)
Patient is 44 and would like to have referral for screening colonoscopy. Okay to proceed with referral?

## 2021-04-20 NOTE — Telephone Encounter (Addendum)
Initiated Prior authorization DIX:VEZBMZT, 0.25 OR 0.5 MG Via: Covermymeds Case/Key:[id number] Status: approved as of 04/20/21 Reason:Effective from 04/20/2021 through 04/19/2022.   Notified Pt via: Mychart

## 2021-04-22 NOTE — Telephone Encounter (Signed)
Dr.Lavoie replied "Yes, can refer to [name] (probably not needed though as recommendation is to start [place] at 46 yo). "  Patient aware referral placed [facility] and number provided for patient to call and schedule.

## 2021-05-29 ENCOUNTER — Encounter: Payer: Self-pay | Admitting: Nurse Practitioner

## 2021-06-03 DIAGNOSIS — L810 Postinflammatory hyperpigmentation: Secondary | ICD-10-CM | POA: Diagnosis not present

## 2021-06-03 DIAGNOSIS — L700 Acne vulgaris: Secondary | ICD-10-CM | POA: Diagnosis not present

## 2021-06-18 ENCOUNTER — Ambulatory Visit: Payer: BC Managed Care – PPO | Admitting: Nurse Practitioner

## 2021-07-01 NOTE — Progress Notes (Signed)
? ? ? ?07/01/2021 ?[name] ?[id number] ?1975/10/06 ? ? ?CHIEF COMPLAINT: Constipation, schedule a colonoscopy  ? ?HISTORY OF PRESENT ILLNESS: [name] is a 46 year old female with no significant past medical history. Past surgical history includes C section x 2 and eft great toe surgery.  ? ?She presents to our office today as referred by Dr Princess Bruins to schedule a colonoscopy. She has chronic constipation x 10 years. She can go one week without passing a BM.  She has taken various laxatives over the past few years.  She is currently taking "colon cleanse" 1 to 2 tabs daily x 3 days as needed. After she takes the colon cleans tabs, she passes a moderate amount of hard then loose stools. No rectal bleeding.  She reported her last bowel movement was 1 week ago.  No upper or lower abdominal pain.  She drinks at least 64 ounces of water daily.  She eats a fairly high-fiber diet.  She has intentionally lost 29 pounds since July 2022.  Mother with history of colon polyps.  No family history of colon cancer. ?  ? ?  Latest Ref Rng & Units 08/07/2020  ?  7:58 AM 07/18/2019  ?  9:42 AM 07/18/2018  ?  8:38 AM  ?CBC  ?WBC 3.8 - 10.8 Thousand/uL 8.9   6.7   6.7    ?Hemoglobin 11.7 - 15.5 g/dL 13.8   13.9   13.8    ?Hematocrit 35.0 - 45.0 % 41.8   40.7   40.4    ?Platelets 140 - 400 Thousand/uL 392   388   435    ?  ? ?  Latest Ref Rng & Units 08/07/2020  ?  7:58 AM 07/18/2019  ?  9:42 AM 07/18/2018  ?  8:38 AM  ?CMP  ?Glucose 65 - 99 mg/dL 102   95   98    ?BUN 7 - 25 mg/dL '10   12   9    '$ ?Creatinine 0.50 - 1.10 mg/dL 0.72   0.91   0.91    ?Sodium 135 - 146 mmol/L 138   139   138    ?Potassium 3.5 - 5.3 mmol/L 4.4   4.3   3.9    ?Chloride 98 - 110 mmol/L 102   104   104    ?CO2 20 - 32 mmol/L '28   29   26    '$ ?Calcium 8.6 - 10.2 mg/dL 9.3   9.5   9.3    ?Total Protein 6.1 - 8.1 g/dL 7.0   6.8   6.9    ?Total Bilirubin 0.2 - 1.2 mg/dL 0.6   0.5   0.6    ?AST 10 - 30 U/L '11   18   12    '$ ?ALT 6 - 29 U/L '13   28    12    '$ ?  ? ?No past medical history on file. ?Past Surgical History:  ?Procedure Laterality Date  ? CESAREAN SECTION  2007  ? CESAREAN SECTION  2012  ? ?Social History: She is married.  She has 1 daughter and 1 son.  Non-smoker.  No alcohol use.  No drug use. ? ?Family History: Mother with history of colon polyps. Father with history of prostate cancer. Maternal grandmother and maternal grandfather with  diabetes. Brother with kidney disease on transplant list.  ? ?No Known Allergies ?  ?Outpatient Encounter Medications as of 07/02/2021  ?Medication Sig  ? Semaglutide,0.25 or  0.'5MG'$ /DOS, (OZEMPIC, 0.25 OR 0.5 MG/DOSE,) 2 MG/1.5ML SOPN Inject 0.25 mg into the skin once a week.  ? ?No facility-administered encounter medications on file as of 07/02/2021.  ? ? ?REVIEW OF SYSTEMS:  ?Gen: Denies fever, sweats or chills. No weight loss.  ?CV: Denies chest pain, palpitations or edema. ?Resp: Denies cough, shortness of breath of hemoptysis.  ?GI: See HPI.  No GERD symptoms. ?GU : Denies urinary burning, blood in urine, increased urinary frequency or incontinence. ?MS: Denies joint pain, muscles aches or weakness. ?Derm: Denies rash, itchiness, skin lesions or unhealing ulcers. ?Psych: Denies depression, anxiety or memory loss. ?Heme: + new bruise to right lower leg.  ?Neuro:  + Occasional headache.  ?Endo:  Denies any problems with DM, thyroid or adrenal function. ? ?PHYSICAL EXAM: ?BP 108/60   Pulse 100   Ht '5\' 3"'$  (1.6 m)   Wt 184 lb (83.5 kg)   SpO2 97%   BMI 32.59 kg/m?  ? ?General: 46 year old female in no acute distress. ?Head: Normocephalic and atraumatic. ?Eyes:  Sclerae non-icteric, conjunctive pink. ?Ears: Normal auditory acuity. ?Mouth: Dentition intact.  A moderate sized area of purple ecchymosis to the upper palate.  Photo taken with the patient's permission but clarity was poor.  See media section  for photo. ?Neck: Supple, no lymphadenopathy or thyromegaly.  ?Lungs: Clear bilaterally to auscultation without  wheezes, crackles or rhonchi. ?Heart: Regular rate and rhythm. No murmur, rub or gallop appreciated.  ?Abdomen: Soft, nontender, non distended. No masses. No hepatosplenomegaly. Normoactive bowel sounds x 4 quadrants.  ?Rectal: Deferred.  ?Musculoskeletal: Symmetrical with no gross deformities. ?Skin: Warm and dry. No rash or lesions on visible extremities. ?Extremities: No edema. ?Neurological: Alert oriented x 4, no focal deficits.  ?Psychological:  Alert and cooperative. Normal mood and affect. ? ?ASSESSMENT AND PLAN: ? ?92) 46 year old female with chronic constipation. Last BM was one week ago. ?-Okay to continue colon cleanse 1-2 tabs once to twice weekly as needed, advised patient to  take dose tonight to empty out bowel prior to initiating Linzess ?-Linzess 145 mcg 1 capsule to be taken 30 minutes before breakfast ?-MiraLAX nightly as needed ? ?2) Colon cancer screening.  Mother with history of colon polyps. ?-Colonoscopy benefits and risks discussed including risk with sedation, risk of bleeding, perforation and infection  ?-Further recommendation to be determined after colonoscopy completed ? ?3) Moderate area of ecchymosis to the upper palate identified on oral exam, significance unclear.  ?-I advised the patient to follow-up with her PCP for further evaluation ? ? ? ?CC:  Princess Bruins, MD ? ? ? ?

## 2021-07-02 ENCOUNTER — Encounter: Payer: Self-pay | Admitting: Nurse Practitioner

## 2021-07-02 ENCOUNTER — Telehealth: Payer: Self-pay

## 2021-07-02 ENCOUNTER — Ambulatory Visit (INDEPENDENT_AMBULATORY_CARE_PROVIDER_SITE_OTHER): Payer: BC Managed Care – PPO | Admitting: Nurse Practitioner

## 2021-07-02 VITALS — BP 108/60 | HR 100 | Ht 63.0 in | Wt 184.0 lb

## 2021-07-02 DIAGNOSIS — Z1211 Encounter for screening for malignant neoplasm of colon: Secondary | ICD-10-CM

## 2021-07-02 DIAGNOSIS — K5900 Constipation, unspecified: Secondary | ICD-10-CM | POA: Diagnosis not present

## 2021-07-02 MED ORDER — LINACLOTIDE 145 MCG PO CAPS: 145.0000 ug | ORAL_CAPSULE | Freq: Every day | ORAL | 1 refills | Status: DC

## 2021-07-02 MED ORDER — NA SULFATE-K SULFATE-MG SULF 17.5-3.13-1.6 GM/177ML PO SOLN: 1.0000 | ORAL | 0 refills | Status: DC

## 2021-07-02 NOTE — Telephone Encounter (Signed)
Spoke with patient and she is in agreement to monitor it for 2 weeks and if it is still there to contact the office to schedule an appointment for further evaluation. ?

## 2021-07-02 NOTE — Progress Notes (Signed)
Addendum: Reviewed and agree with assessment and management plan. Sanjith Siwek M, MD  

## 2021-07-02 NOTE — Telephone Encounter (Signed)
Patient went for a colonoscopy screening today and they noticed that she had something in her mouth and took pictures and sent them to [place].  ? ?She was wanting you to look at the pictures and let her know what it is or if it is something that she needs to come in the office for to let her know. ? ?She concerned on what it is. ?

## 2021-07-02 NOTE — Patient Instructions (Signed)
You have been scheduled for a colonoscopy. Please follow the written instructions given to you at your visit today. ?Please pick up your prep supplies at the pharmacy within the next 1-3 days. ?If you use inhalers (even only as needed), please bring them with you on the day of your procedure. ? ?RECOMMENDATIONS: ? ?Miralax- Dissolve one capful in 8 ounces of water and drink before bed. ?Follow up with your primary care provider regarding bruise to the upper palate in your mouth. ? ?PRESCRIPTION MEDICATION(S):  ? ?We have sent the following medication(s) to your pharmacy: ? ?Linzess 145 MCG. Take 1 capsule in the morning before breakfast. ? ?NOTE: If your medication(s) requires a PRIOR AUTHORIZATION, we will receive notification from your pharmacy. Once received, the process to submit for approval may take up to 7-10 business days. You will be contacted about any denials we have received from your insurance company as well as alternatives recommended by your provider. ? ?Thank you for trusting me with your gastrointestinal care!   ? ?Noralyn Pick, CRNP ? ? ? ?BMI: ? ?If you are age 52 or older, your body mass index should be between 23-30. Your Body mass index is 32.59 kg/m?Marland Kitchen If this is out of the aforementioned range listed, please consider follow up with your Primary Care Provider. ? ?If you are age 87 or younger, your body mass index should be between 19-25. Your Body mass index is 32.59 kg/m?Marland Kitchen If this is out of the aformentioned range listed, please consider follow up with your Primary Care Provider.  ? ?MY CHART: ? ?The [facility] providers would like to encourage you to use [facility] to communicate with providers for non-urgent requests or questions.  Due to long hold times on the telephone, sending your provider a message by [facility] may be a faster and more efficient way to get a response.  Please allow 48 business hours for a response.  Please remember that this is for non-urgent requests.  ? ?

## 2021-07-21 ENCOUNTER — Other Ambulatory Visit (HOSPITAL_COMMUNITY): Payer: Self-pay

## 2021-07-21 ENCOUNTER — Telehealth: Payer: Self-pay | Admitting: Pharmacy Technician

## 2021-07-21 NOTE — Telephone Encounter (Signed)
Patient Advocate Encounter  Received notification from [place] that prior authorization for [facility] 145MCG is required.   PA submitted on 5.23.23 Key [id number] Status is pending   [facility] will continue to follow  Luciano Cutter, CPhT Patient Advocate Phone: [phone]

## 2021-07-22 ENCOUNTER — Other Ambulatory Visit: Payer: Self-pay

## 2021-07-22 DIAGNOSIS — K5900 Constipation, unspecified: Secondary | ICD-10-CM

## 2021-07-22 MED ORDER — TRULANCE 3 MG PO TABS: 3.0000 mg | ORAL_TABLET | Freq: Every day | ORAL | 2 refills | Status: DC

## 2021-07-22 NOTE — Telephone Encounter (Signed)
Received a fax regarding Prior Authorization from [facility] for LINZESS 145MCG. Authorization has been DENIED because PT HAS NOT TRIED/FAILED TRULANCE.  If Trulance is prescribed, PA will be required for it per the denial letter.

## 2021-07-22 NOTE — Telephone Encounter (Signed)
Rx sent to pt preferred pharmacy. Called pt and informed of Dr. Vena Rua response. Verbalized acceptance and understanding.

## 2021-07-22 NOTE — Telephone Encounter (Signed)
I am okay trying Trulance in place of Linzess Trulance 3 mg once daily Patient should let us know if this is ineffective for her  Plan otherwise per office note recently with Carl Best

## 2021-07-23 NOTE — Telephone Encounter (Signed)
Patient Advocate Encounter  Received notification from [place] that prior authorization for TRULANCE '3MG'$  is required.   PA submitted on 5.25.23 Key [id number] Status is pending   [facility] will continue to follow  Luciano Cutter, CPhT Patient Advocate Phone: [phone]

## 2021-08-01 ENCOUNTER — Other Ambulatory Visit (HOSPITAL_COMMUNITY): Payer: Self-pay

## 2021-08-01 NOTE — Telephone Encounter (Signed)
Received notification from [facility] regarding a prior authorization for TRULANCE '3MG'$ . Authorization has been APPROVED from 05.25.23 to 5.23.24.

## 2021-08-07 ENCOUNTER — Ambulatory Visit: Payer: BC Managed Care – PPO | Admitting: Medical-Surgical

## 2021-08-07 ENCOUNTER — Ambulatory Visit: Payer: BC Managed Care – PPO | Admitting: Osteopathic Medicine

## 2021-08-11 ENCOUNTER — Encounter: Payer: 59 | Admitting: Internal Medicine

## 2021-08-14 ENCOUNTER — Telehealth: Payer: Self-pay | Admitting: Neurology

## 2021-08-14 DIAGNOSIS — Z1329 Encounter for screening for other suspected endocrine disorder: Secondary | ICD-10-CM

## 2021-08-14 DIAGNOSIS — Z131 Encounter for screening for diabetes mellitus: Secondary | ICD-10-CM

## 2021-08-14 DIAGNOSIS — Z0000 Encounter for general adult medical examination without abnormal findings: Secondary | ICD-10-CM

## 2021-08-14 DIAGNOSIS — Z13220 Encounter for screening for lipoid disorders: Secondary | ICD-10-CM

## 2021-08-14 NOTE — Telephone Encounter (Signed)
Patient left vm asking for labs to be ordered before appointment on Monday. Lab order placed. Patient made aware.

## 2021-08-17 ENCOUNTER — Ambulatory Visit (INDEPENDENT_AMBULATORY_CARE_PROVIDER_SITE_OTHER): Payer: 59 | Admitting: Physician Assistant

## 2021-08-17 VITALS — BP 106/80 | HR 74 | Ht 63.0 in | Wt 183.0 lb

## 2021-08-17 DIAGNOSIS — Z0000 Encounter for general adult medical examination without abnormal findings: Secondary | ICD-10-CM

## 2021-08-17 DIAGNOSIS — Z131 Encounter for screening for diabetes mellitus: Secondary | ICD-10-CM | POA: Diagnosis not present

## 2021-08-17 DIAGNOSIS — Z1329 Encounter for screening for other suspected endocrine disorder: Secondary | ICD-10-CM | POA: Diagnosis not present

## 2021-08-17 DIAGNOSIS — E6609 Other obesity due to excess calories: Secondary | ICD-10-CM

## 2021-08-17 DIAGNOSIS — Z13220 Encounter for screening for lipoid disorders: Secondary | ICD-10-CM

## 2021-08-17 DIAGNOSIS — Z6832 Body mass index (BMI) 32.0-32.9, adult: Secondary | ICD-10-CM

## 2021-08-17 MED ORDER — SEMAGLUTIDE (1 MG/DOSE) 4 MG/3ML ~~LOC~~ SOPN: 1.0000 mg | PEN_INJECTOR | SUBCUTANEOUS | 1 refills | Status: DC

## 2021-08-17 NOTE — Progress Notes (Signed)
Subjective:     [name] is a 46 y.o. female and is here for a comprehensive physical exam. The patient reports no problems.  She would like increase ozempic for weight loss. She is tolerating well. She has lost 1lb this month. She is exercising and trying to eat better.     Social History   Socioeconomic History   Marital status: Married    Spouse name: Not on file   Number of children: Not on file   Years of education: Not on file   Highest education level: Not on file  Occupational History   Not on file  Tobacco Use   Smoking status: Never   Smokeless tobacco: Never  Vaping Use   Vaping Use: Never used  Substance and Sexual Activity   Alcohol use: No   Drug use: No   Sexual activity: Yes    Partners: Male    Birth control/protection: Pill    Comment: 1st intercourse- 59, partners- greater than 5, married- 40 yrs   Other Topics Concern   Not on file  Social History Narrative   Not on file   Social Determinants of Health   Financial Resource Strain: Not on file  Food Insecurity: Not on file  Transportation Needs: Not on file  Physical Activity: Not on file  Stress: Not on file  Social Connections: Not on file  Intimate Partner Violence: Not on file   Health Maintenance  Topic Date Due   COLONOSCOPY (Pts 45-78yr Insurance coverage will need to be confirmed)  Never done   COVID-19 Vaccine (3 - Moderna series) 09/02/2021 (Originally 02/27/2020)   Hepatitis C Screening  08/18/2022 (Originally 01/19/1994)   INFLUENZA VACCINE  09/29/2021   PAP SMEAR-Modifier  07/04/2022   TETANUS/TDAP  08/08/2030   HPV VACCINES  Aged Out   HIV Screening  Discontinued    The following portions of the patient's history were reviewed and updated as appropriate: allergies, current medications, past family history, past medical history, past social history, past surgical history, and problem list.  Review of Systems A comprehensive review of systems was negative.    Objective:    BP 106/80   Pulse 74   Ht '5\' 3"'$  (1.6 m)   Wt 183 lb (83 kg)   SpO2 99%   BMI 32.42 kg/m  General appearance: alert, cooperative, appears stated age, and mildly obese Head: Normocephalic, without obvious abnormality, atraumatic Eyes: conjunctivae/corneas clear. PERRL, EOM's intact. Fundi benign. Ears: normal TM's and external ear canals both ears Nose: Nares normal. Septum midline. Mucosa normal. No drainage or sinus tenderness. Throat: lips, mucosa, and tongue normal; teeth and gums normal Neck: no adenopathy, no carotid bruit, no JVD, supple, symmetrical, trachea midline, and thyroid not enlarged, symmetric, no tenderness/mass/nodules Back: symmetric, no curvature. ROM normal. No CVA tenderness. Lungs: clear to auscultation bilaterally Heart: regular rate and rhythm, S1, S2 normal, no murmur, click, rub or gallop Abdomen: soft, non-tender; bowel sounds normal; no masses,  no organomegaly Extremities: extremities normal, atraumatic, no cyanosis or edema Pulses: 2+ and symmetric Skin: Skin color, texture, turgor normal. No rashes or lesions Lymph nodes: Cervical, supraclavicular, and axillary nodes normal. Neurologic: Alert and oriented X 3, normal strength and tone. Normal symmetric reflexes. Normal coordination and gait   ..Marland Kitchen   04/13/2021    3:38 PM 08/07/2020    7:48 AM 07/18/2019    9:06 AM 06/08/2017    4:45 PM  Depression screen PHQ 2/9  Decreased Interest 0 0 0  0  Down, Depressed, Hopeless 0 0 0 0  PHQ - 2 Score 0 0 0 0  Altered sleeping   0 0  Tired, decreased energy   1 0  Change in appetite   1 0  Feeling bad or failure about yourself    0 0  Trouble concentrating   0 0  Moving slowly or fidgety/restless   0 0  Suicidal thoughts   0 0  PHQ-9 Score   2 0  Difficult doing work/chores   Not difficult at all Not difficult at all    Assessment:    Healthy female exam.    Plan:  Marland KitchenMarland KitchenZakayla was seen today for annual exam.  Diagnoses and all orders  for this visit:  Annual physical exam  Thyroid disorder screen  Lipid screening  Diabetes mellitus screening  Class 1 obesity due to excess calories without serious comorbidity with body mass index (BMI) of 32.0 to 32.9 in adult -     Semaglutide, 1 MG/DOSE, 4 MG/3ML SOPN; Inject 1 mg as directed once a week.   .. Discussed 150 minutes of exercise a week.  Encouraged vitamin D 1000 units and Calcium '1300mg'$  or 4 servings of dairy a day.  PHQ no concerns Fasting labs drawn today Pap/mammo up to date Colonoscopy scheduled for July.  Increased ozempic to '1mg'$ .  Covid vaccine x3.  Filled out work forms.  Follow up in 6 months with PcP.    See After Visit Summary for Counseling Recommendations

## 2021-08-17 NOTE — Patient Instructions (Signed)

## 2021-08-18 LAB — COMPLETE METABOLIC PANEL WITH GFR
AG Ratio: 1.8 (calc) (ref 1.0–2.5)
ALT: 11 U/L (ref 6–29)
AST: 12 U/L (ref 10–35)
Albumin: 4 g/dL (ref 3.6–5.1)
Alkaline phosphatase (APISO): 74 U/L (ref 31–125)
BUN: 15 mg/dL (ref 7–25)
CO2: 29 mmol/L (ref 20–32)
Calcium: 8.6 mg/dL (ref 8.6–10.2)
Chloride: 105 mmol/L (ref 98–110)
Creat: 0.7 mg/dL (ref 0.50–0.99)
Globulin: 2.2 g/dL (calc) (ref 1.9–3.7)
Glucose, Bld: 86 mg/dL (ref 65–99)
Potassium: 4.4 mmol/L (ref 3.5–5.3)
Sodium: 140 mmol/L (ref 135–146)
Total Bilirubin: 0.4 mg/dL (ref 0.2–1.2)
Total Protein: 6.2 g/dL (ref 6.1–8.1)
eGFR: 109 mL/min/{1.73_m2} (ref >=60)

## 2021-08-18 LAB — CBC WITH DIFFERENTIAL/PLATELET
Absolute Monocytes: 590 cells/uL (ref 200–950)
Basophils Absolute: 50 cells/uL (ref 0–200)
Basophils Relative: 0.5 %
Eosinophils Absolute: 510 cells/uL — ABNORMAL HIGH (ref 15–500)
Eosinophils Relative: 5.1 %
HCT: 37 % (ref 35.0–45.0)
Hemoglobin: 12.8 g/dL (ref 11.7–15.5)
Lymphs Abs: 1930 cells/uL (ref 850–3900)
MCH: 28.4 pg (ref 27.0–33.0)
MCHC: 34.6 g/dL (ref 32.0–36.0)
MCV: 82 fL (ref 80.0–100.0)
MPV: 10 fL (ref 7.5–12.5)
Monocytes Relative: 5.9 %
Neutro Abs: 6920 cells/uL (ref 1500–7800)
Neutrophils Relative %: 69.2 %
Platelets: 373 10*3/uL (ref 140–400)
RBC: 4.51 10*6/uL (ref 3.80–5.10)
RDW: 13.4 % (ref 11.0–15.0)
Total Lymphocyte: 19.3 %
WBC: 10 10*3/uL (ref 3.8–10.8)

## 2021-08-18 LAB — LIPID PANEL W/REFLEX DIRECT LDL
Cholesterol: 140 mg/dL (ref <200)
HDL: 58 mg/dL (ref >=50)
LDL Cholesterol (Calc): 72 mg/dL (calc)
Non-HDL Cholesterol (Calc): 82 mg/dL (calc) (ref <130)
Total CHOL/HDL Ratio: 2.4 (calc) (ref <5.0)
Triglycerides: 35 mg/dL (ref <150)

## 2021-08-18 LAB — TSH: TSH: 2.82 mIU/L

## 2021-08-18 NOTE — Progress Notes (Signed)
[name],   Hemoglobin looks great.  Eosinophils a little high, likely allergies.  Thyroid looks good.  Cholesterol looks wonderful.  Kidney, liver, glucose look great.   We will fill out forms for work and get them faxed in!

## 2021-08-21 ENCOUNTER — Other Ambulatory Visit: Payer: Self-pay | Admitting: Obstetrics & Gynecology

## 2021-08-21 DIAGNOSIS — Z1231 Encounter for screening mammogram for malignant neoplasm of breast: Secondary | ICD-10-CM

## 2021-08-27 ENCOUNTER — Ambulatory Visit
Admission: RE | Admit: 2021-08-27 | Discharge: 2021-08-27 | Disposition: A | Discharge status: Home / Self Care | Payer: 59 | Source: Ambulatory Visit | Attending: Obstetrics & Gynecology | Admitting: Obstetrics & Gynecology

## 2021-08-27 ENCOUNTER — Other Ambulatory Visit (HOSPITAL_COMMUNITY): Payer: Self-pay

## 2021-08-27 DIAGNOSIS — Z1231 Encounter for screening mammogram for malignant neoplasm of breast: Secondary | ICD-10-CM

## 2021-08-27 MED ORDER — LINACLOTIDE 145 MCG PO CAPS: 145.0000 ug | ORAL_CAPSULE | Freq: Every day | ORAL | 2 refills | Status: DC

## 2021-08-27 NOTE — Telephone Encounter (Signed)
Patient had a change of Prescription insurance- [employer] and her new plan prefers Linzess. Please see insurance response below.

## 2021-08-27 NOTE — Addendum Note (Signed)
Addended by: Larina Bras on: 08/27/2021 11:03 AM   Modules accepted: Orders

## 2021-08-27 NOTE — Telephone Encounter (Signed)
Received new PA request from pharmacy. Looks like patient has new insurance.  Submitted a Prior Authorization request to [employer] for  Trulance '3mg'$   via [employer] Portal. Will update once we receive a response.  ID# [id number] Prior Auth (EOC) ID:  [id number]

## 2021-08-27 NOTE — Telephone Encounter (Signed)
Looks like we originally wanted patient on Linzess anyway. The only reason rx was changed was because insurance denied the Linzess without her having tried Trulance. Since new insurance will allow Linzess, will send new rx to pharmacy for the [place].

## 2021-09-02 ENCOUNTER — Other Ambulatory Visit: Payer: Self-pay | Admitting: Obstetrics & Gynecology

## 2021-09-02 DIAGNOSIS — R928 Other abnormal and inconclusive findings on diagnostic imaging of breast: Secondary | ICD-10-CM

## 2021-09-03 ENCOUNTER — Telehealth: Payer: Self-pay | Admitting: Pharmacy Technician

## 2021-09-03 ENCOUNTER — Other Ambulatory Visit: Payer: Self-pay | Admitting: Medical-Surgical

## 2021-09-03 ENCOUNTER — Other Ambulatory Visit (HOSPITAL_COMMUNITY): Payer: Self-pay

## 2021-09-03 NOTE — Telephone Encounter (Signed)
Patient Advocate Encounter  Received notification from [place] that prior authorization for [facility] '145MG'$  is required.   PA submitted on 09/03/2021 via [employer] EOC ID: [id number] Status is pending    Luciano Cutter, CPhT Patient Advocate Phone: [phone]

## 2021-09-08 NOTE — Telephone Encounter (Signed)
Patient Advocate Encounter  Prior Authorization for Linzess '145mg'$  has been approved.    PA# [id number] Effective dates: 09-03-21 through 09-03-22  Clista Bernhardt, CPhT Rx Patient Advocate

## 2021-09-09 ENCOUNTER — Ambulatory Visit
Admission: RE | Admit: 2021-09-09 | Discharge: 2021-09-09 | Disposition: A | Discharge status: Home / Self Care | Payer: 59 | Source: Ambulatory Visit | Attending: Obstetrics & Gynecology | Admitting: Obstetrics & Gynecology

## 2021-09-09 DIAGNOSIS — R928 Other abnormal and inconclusive findings on diagnostic imaging of breast: Secondary | ICD-10-CM

## 2021-09-15 ENCOUNTER — Telehealth: Payer: Self-pay | Admitting: Nurse Practitioner

## 2021-09-15 NOTE — Telephone Encounter (Signed)
Prep instructions updated and sent to pt via mychart

## 2021-09-15 NOTE — Telephone Encounter (Signed)
Patient called requesting new instructions for her upcoming procedure as the prep and date was changed.  She said it was ok to send them to My Chart.  Thank you.

## 2021-09-28 ENCOUNTER — Encounter: Payer: Self-pay | Admitting: Internal Medicine

## 2021-09-28 ENCOUNTER — Ambulatory Visit (AMBULATORY_SURGERY_CENTER): Payer: 59 | Admitting: Internal Medicine

## 2021-09-28 VITALS — BP 96/66 | HR 86 | Temp 98.4°F | Resp 12 | Ht 63.0 in | Wt 183.0 lb

## 2021-09-28 DIAGNOSIS — D123 Benign neoplasm of transverse colon: Secondary | ICD-10-CM

## 2021-09-28 DIAGNOSIS — Z1211 Encounter for screening for malignant neoplasm of colon: Secondary | ICD-10-CM | POA: Diagnosis present

## 2021-09-28 DIAGNOSIS — D122 Benign neoplasm of ascending colon: Secondary | ICD-10-CM

## 2021-09-28 DIAGNOSIS — K635 Polyp of colon: Secondary | ICD-10-CM | POA: Diagnosis not present

## 2021-09-28 MED ORDER — SODIUM CHLORIDE 0.9 % IV SOLN: 500.0000 mL | Freq: Once | INTRAVENOUS | Status: DC

## 2021-09-28 NOTE — Progress Notes (Signed)
PT taken to PACU. Monitors in place. VSS. Report given to RN. 

## 2021-09-28 NOTE — Op Note (Signed)
[facility] Patient Name: [name] Procedure Date: 09/28/2021 2:05 PM MRN: [id number] Endoscopist: Jerene Bears , MD Age: 46 Referring MD:  Date of Birth: 1975-03-03 Gender: Female Account #: [id number] Procedure:                Colonoscopy Indications:              Screening for colorectal malignant neoplasm, This                            is the patient's first colonoscopy Medicines:                Monitored Anesthesia Care Procedure:                Pre-Anesthesia Assessment:                           - Prior to the procedure, a History and Physical                            was performed, and patient medications and                            allergies were reviewed. The patient's tolerance of                            previous anesthesia was also reviewed. The risks                            and benefits of the procedure and the sedation                            options and risks were discussed with the patient.                            All questions were answered, and informed consent                            was obtained. Prior Anticoagulants: The patient has                            taken no previous anticoagulant or antiplatelet                            agents. ASA Grade Assessment: II - A patient with                            mild systemic disease. After reviewing the risks                            and benefits, the patient was deemed in                            satisfactory condition to undergo the procedure.  After obtaining informed consent, the colonoscope                            was passed under direct vision. Throughout the                            procedure, the patient's blood pressure, pulse, and                            oxygen saturations were monitored continuously. The                            CF [id number] #[id number] was introduced through the anus                            and advanced to the  cecum, identified by                            appendiceal orifice and ileocecal valve. The                            colonoscopy was performed without difficulty. The                            patient tolerated the procedure well. The quality                            of the bowel preparation was good. The ileocecal                            valve, appendiceal orifice, and rectum were                            photographed. Scope In: 2:11:51 PM Scope Out: 2:32:08 PM Scope Withdrawal Time: 0 hours 14 minutes 20 seconds  Total Procedure Duration: 0 hours 20 minutes 17 seconds  Findings:                 The digital rectal exam was normal.                           Two sessile polyps were found in the ascending                            colon. The polyps were 4 to 7 mm in size. These                            polyps were removed with a cold snare. Resection                            and retrieval were complete.                           A 5 mm polyp was found in the transverse colon. The  polyp was sessile. The polyp was removed with a                            cold snare. Resection and retrieval were complete.                           The exam was otherwise without abnormality on                            direct and retroflexion views. Complications:            No immediate complications. Estimated Blood Loss:     Estimated blood loss was minimal. Impression:               - Two 4 to 7 mm polyps in the ascending colon,                            removed with a cold snare. Resected and retrieved.                           - One 5 mm polyp in the transverse colon, removed                            with a cold snare. Resected and retrieved.                           - The examination was otherwise normal on direct                            and retroflexion views. Recommendation:           - Patient has a contact number available for                             emergencies. The signs and symptoms of potential                            delayed complications were discussed with the                            patient. Return to normal activities tomorrow.                            Written discharge instructions were provided to the                            patient.                           - Resume previous diet.                           - Continue present medications.                           - Await pathology  results.                           - Repeat colonoscopy is recommended. The                            colonoscopy date will be determined after pathology                            results from today's exam become available for                            review. Jerene Bears, MD 09/28/2021 2:34:32 PM This report has been signed electronically.

## 2021-09-28 NOTE — Progress Notes (Signed)
Called to room to assist during endoscopic procedure.  Patient ID and intended procedure confirmed with present staff. Received instructions for my participation in the procedure from the performing physician.  

## 2021-09-28 NOTE — Progress Notes (Signed)
GASTROENTEROLOGY PROCEDURE H&P NOTE   Primary Care Physician: Samuel Bouche, NP    Reason for Procedure:  Colon cancer screening  Plan:    Colonoscopy  Patient is appropriate for endoscopic procedure(s) in the ambulatory ([facility]) setting.  The nature of the procedure, as well as the risks, benefits, and alternatives were carefully and thoroughly reviewed with the patient. Ample time for discussion and questions allowed. The patient understood, was satisfied, and agreed to proceed.     HPI: [name] is a 46 y.o. female who presents for screening colonoscopy.  Medical history as below.  Tolerated the prep.  No recent chest pain or shortness of breath.  No abdominal pain today.  Past Medical History:  Diagnosis Date   Allergy     Past Surgical History:  Procedure Laterality Date   CESAREAN SECTION  2007   CESAREAN SECTION  2012   WISDOM TOOTH EXTRACTION Bilateral 1998    Prior to Admission medications   Medication Sig Start Date End Date Taking? Authorizing Provider  linaclotide Rolan Lipa) 145 MCG CAPS capsule Take 1 capsule (145 mcg total) by mouth daily before breakfast. 08/27/21  Yes Kennedy-Smith, Patrecia Pour, NP  Semaglutide, 1 MG/DOSE, 4 MG/3ML SOPN Inject 1 mg as directed once a week. 08/17/21  Yes Breeback, Jade L, PA-C  spironolactone (ALDACTONE) 50 MG tablet Take 50 mg by mouth daily. 06/18/21  Yes [provider]  Plecanatide (TRULANCE) 3 MG TABS Take 3 mg by mouth daily. Patient not taking: Reported on 09/28/2021 07/22/21   Jerene Bears, MD    Current Outpatient Medications  Medication Sig Dispense Refill   linaclotide (LINZESS) 145 MCG CAPS capsule Take 1 capsule (145 mcg total) by mouth daily before breakfast. 30 capsule 2   Semaglutide, 1 MG/DOSE, 4 MG/3ML SOPN Inject 1 mg as directed once a week. 9 mL 1   spironolactone (ALDACTONE) 50 MG tablet Take 50 mg by mouth daily.     Plecanatide (TRULANCE) 3 MG TABS Take 3 mg by mouth daily. (Patient not  taking: Reported on 09/28/2021) 30 tablet 2   Current Facility-Administered Medications  Medication Dose Route Frequency Provider Last Rate Last Admin   0.9 %  sodium chloride infusion  500 mL Intravenous Once Cainan Trull, Lajuan Lines, MD        Allergies as of 09/28/2021   (No Known Allergies)    Family History  Problem Relation Age of Onset   Prostate cancer Father    Kidney cancer Brother    Diabetes Maternal Grandmother    Diabetes Maternal Grandfather    Liver disease Maternal Grandfather    Colon cancer Neg Hx    Liver cancer Neg Hx    Esophageal cancer Neg Hx    Stomach cancer Neg Hx    Pancreatic cancer Neg Hx     Social History   Socioeconomic History   Marital status: Married    Spouse name: Not on file   Number of children: Not on file   Years of education: Not on file   Highest education level: Not on file  Occupational History   Not on file  Tobacco Use   Smoking status: Never   Smokeless tobacco: Never  Vaping Use   Vaping Use: Never used  Substance and Sexual Activity   Alcohol use: No   Drug use: No   Sexual activity: Yes    Partners: Male    Birth control/protection: Pill    Comment: 1st intercourse- 62, partners- greater than  5, married- 71 yrs   Other Topics Concern   Not on file  Social History Narrative   Not on file   Social Determinants of Health   Financial Resource Strain: Not on file  Food Insecurity: Not on file  Transportation Needs: Not on file  Physical Activity: Not on file  Stress: Not on file  Social Connections: Not on file  Intimate Partner Violence: Not on file    Physical Exam: Vital signs in last 24 hours: '@BP'$  112/66   Pulse 92   Temp 98.4 F (36.9 C) (Temporal)   Ht '5\' 3"'$  (1.6 m)   Wt 183 lb (83 kg)   SpO2 97%   BMI 32.42 kg/m  GEN: NAD EYE: Sclerae anicteric ENT: MMM CV: Non-tachycardic Pulm: CTA b/l GI: Soft, NT/ND NEURO:  Alert & Oriented x 3   Zenovia Jarred, MD [facility]  09/28/2021 2:05  PM

## 2021-09-28 NOTE — Patient Instructions (Signed)
Handout on polyps given to you today  Await pathology results from Dr. Hilarie Fredrickson   YOU HAD AN ENDOSCOPIC PROCEDURE TODAY AT THE [facility]:   Refer to the procedure report that was given to you for any specific questions about what was found during the examination.  If the procedure report does not answer your questions, please call your gastroenterologist to clarify.  If you requested that your care partner not be given the details of your procedure findings, then the procedure report has been included in a sealed envelope for you to review at your convenience later.  YOU SHOULD EXPECT: Some feelings of bloating in the abdomen. Passage of more gas than usual.  Walking can help get rid of the air that was put into your GI tract during the procedure and reduce the bloating. If you had a lower endoscopy (such as a colonoscopy or flexible sigmoidoscopy) you may notice spotting of blood in your stool or on the toilet paper. If you underwent a bowel prep for your procedure, you may not have a normal bowel movement for a few days.  Please Note:  You might notice some irritation and congestion in your nose or some drainage.  This is from the oxygen used during your procedure.  There is no need for concern and it should clear up in a day or so.  SYMPTOMS TO REPORT IMMEDIATELY:  Following lower endoscopy (colonoscopy or flexible sigmoidoscopy):  Excessive amounts of blood in the stool  Significant tenderness or worsening of abdominal pains  Swelling of the abdomen that is new, acute  Fever of 100F or higher  For urgent or emergent issues, a gastroenterologist can be reached at any hour by calling [phone]. Do not use MyChart messaging for urgent concerns.    DIET:  We do recommend a small meal at first, but then you may proceed to your regular diet.  Drink plenty of fluids but you should avoid alcoholic beverages for 24 hours.  ACTIVITY:  You should plan to take it easy for the rest  of today and you should NOT DRIVE or use heavy machinery until tomorrow (because of the sedation medicines used during the test).    FOLLOW UP: Our staff will call the number listed on your records the next business day following your procedure.  We will call around 7:15- 8:00 am to check on you and address any questions or concerns that you may have regarding the information given to you following your procedure. If we do not reach you, we will leave a message.  If you develop any symptoms (ie: fever, flu-like symptoms, shortness of breath, cough etc.) before then, please call [phone].  If you test positive for Covid 19 in the 2 weeks post procedure, please call and report this information to [place].    If any biopsies were taken you will be contacted by phone or by letter within the next 1-3 weeks.  Please call us at [phone] if you have not heard about the biopsies in 3 weeks.    SIGNATURES/CONFIDENTIALITY: You and/or your care partner have signed paperwork which will be entered into your electronic medical record.  These signatures attest to the fact that that the information above on your After Visit Summary has been reviewed and is understood.  Full responsibility of the confidentiality of this discharge information lies with you and/or your care-partner.

## 2021-09-29 ENCOUNTER — Telehealth: Payer: Self-pay | Admitting: *Deleted

## 2021-09-29 NOTE — Telephone Encounter (Signed)
  Follow up Call-     09/28/2021    1:13 PM  Call back number  Post procedure Call Back phone  # [phone]  Permission to leave phone message Yes     Patient questions:  Do you have a fever, pain , or abdominal swelling? No. Pain Score  0 *  Have you tolerated food without any problems? Yes.    Have you been able to return to your normal activities? Yes.    Do you have any questions about your discharge instructions: Diet   No. Medications  No. Follow up visit  No.  Do you have questions or concerns about your Care? No.  Actions: * If pain score is 4 or above: No action needed, pain <4.

## 2021-10-05 ENCOUNTER — Encounter: Payer: Self-pay | Admitting: Internal Medicine

## 2021-10-16 ENCOUNTER — Ambulatory Visit: Payer: BC Managed Care – PPO | Admitting: Obstetrics & Gynecology

## 2021-10-19 ENCOUNTER — Other Ambulatory Visit (HOSPITAL_COMMUNITY)
Admission: RE | Admit: 2021-10-19 | Discharge: 2021-10-19 | Disposition: A | Discharge status: Home / Self Care | Payer: 59 | Source: Ambulatory Visit | Attending: Obstetrics & Gynecology | Admitting: Obstetrics & Gynecology

## 2021-10-19 ENCOUNTER — Ambulatory Visit (INDEPENDENT_AMBULATORY_CARE_PROVIDER_SITE_OTHER): Payer: 59 | Admitting: Obstetrics & Gynecology

## 2021-10-19 ENCOUNTER — Ambulatory Visit: Payer: BC Managed Care – PPO | Admitting: Obstetrics & Gynecology

## 2021-10-19 ENCOUNTER — Encounter: Payer: Self-pay | Admitting: Obstetrics & Gynecology

## 2021-10-19 VITALS — BP 118/80 | Ht 63.0 in | Wt 177.0 lb

## 2021-10-19 DIAGNOSIS — Z975 Presence of (intrauterine) contraceptive device: Secondary | ICD-10-CM

## 2021-10-19 DIAGNOSIS — Z01419 Encounter for gynecological examination (general) (routine) without abnormal findings: Secondary | ICD-10-CM | POA: Diagnosis present

## 2021-10-19 NOTE — Progress Notes (Signed)
[name] Jan 15, 1976 [id number]   History:    46 y.o. G2P2L2 Married.  Son is 74, daughter is 46 yo.   RP:  Established patient presenting for annual gyn exam    HPI: Well on Mirena IUD x 07/2019.  No BTB.  No pelvic pain.  No pain with intercourse.  Pap Neg 06/2019.  Pap reflex today. Urine and bowel movements normal.  Colono 08/2021 Benign Polyps removed.  Breasts normal.  Last mammogram 07/2021, Rt Dx mammo/US benign 08/2021. Body mass index decreased to 31.35 on Ozempic because of an elevated HBA1C.  Will repeat HBA1C with her Fam NP 11/2021. Health labs with family physician.   Past medical history,surgical history, family history and social history were all reviewed and documented in the EPIC chart.  Gynecologic History Patient's last menstrual period was 10/12/2021.  Obstetric History OB History  Gravida Para Term Preterm AB Living  '2 2       2  '$ SAB IAB Ectopic Multiple Live Births               # Outcome Date GA Lbr Len/2nd Weight Sex Delivery Anes PTL Lv  2 Para           1 Para              ROS: A ROS was performed and pertinent positives and negatives are included in the history. GENERAL: No fevers or chills. HEENT: No change in vision, no earache, sore throat or sinus congestion. NECK: No pain or stiffness. CARDIOVASCULAR: No chest pain or pressure. No palpitations. PULMONARY: No shortness of breath, cough or wheeze. GASTROINTESTINAL: No abdominal pain, nausea, vomiting or diarrhea, melena or bright red blood per rectum. GENITOURINARY: No urinary frequency, urgency, hesitancy or dysuria. MUSCULOSKELETAL: No joint or muscle pain, no back pain, no recent trauma. DERMATOLOGIC: No rash, no itching, no lesions. ENDOCRINE: No polyuria, polydipsia, no heat or cold intolerance. No recent change in weight. HEMATOLOGICAL: No anemia or easy bruising or bleeding. NEUROLOGIC: No headache, seizures, numbness, tingling or weakness. PSYCHIATRIC: No depression, no loss of interest in  normal activity or change in sleep pattern.     Exam:   BP 118/80 (BP Location: Right Arm, Patient Position: Sitting, Cuff Size: Normal)   Ht '5\' 3"'$  (1.6 m)   Wt 177 lb (80.3 kg)   LMP 10/12/2021   BMI 31.35 kg/m   Body mass index is 31.35 kg/m.  General appearance : Well developed well nourished female. No acute distress HEENT: Eyes: no retinal hemorrhage or exudates,  Neck supple, trachea midline, no carotid bruits, no thyroidmegaly Lungs: Clear to auscultation, no rhonchi or wheezes, or rib retractions  Heart: Regular rate and rhythm, no murmurs or gallops Breast:Examined in sitting and supine position were symmetrical in appearance, no palpable masses or tenderness,  no skin retraction, no nipple inversion, no nipple discharge, no skin discoloration, no axillary or supraclavicular lymphadenopathy Abdomen: no palpable masses or tenderness, no rebound or guarding Extremities: no edema or skin discoloration or tenderness  Pelvic: Vulva: Normal             Vagina: No gross lesions or discharge  Cervix: No gross lesions or discharge.  [facility] strings [facility] at the [facility]  Pap reflex done.  Uterus  AV, normal size, shape and consistency, non-tender and mobile  Adnexa  Without masses or tenderness  Anus: Normal   Assessment/Plan:  46 y.o. female for annual exam   1. Encounter for routine gynecological  examination with Papanicolaou smear of cervix Well on Mirena IUD x 07/2019.  No BTB.  No pelvic pain.  No pain with intercourse.  Pap Neg 06/2019.  Pap reflex today. Urine and bowel movements normal.  Colono 08/2021 Benign Polyps removed.  Breasts normal.  Last mammogram 07/2021, Rt Dx mammo/US benign 08/2021. Body mass index decreased to 31.35 on Ozempic because of an elevated HBA1C.  Will repeat HBA1C with her Fam NP 11/2021. Health labs with family physician. - Cytology - PAP( [facility])  2. Uses hormone releasing intrauterine device (IUD) for contraception  Well on Mirena IUD x 07/2019.   No BTB.  No pelvic pain.  No pain with intercourse. IUD in good position.  Princess Bruins MD, 2:42 PM 10/19/2021

## 2021-10-23 LAB — CYTOLOGY - PAP
Diagnosis: NEGATIVE
Diagnosis: REACTIVE

## 2021-10-26 ENCOUNTER — Ambulatory Visit: Payer: 59 | Admitting: Medical-Surgical

## 2021-12-12 ENCOUNTER — Other Ambulatory Visit: Payer: Self-pay | Admitting: Physician Assistant

## 2021-12-12 DIAGNOSIS — E6609 Other obesity due to excess calories: Secondary | ICD-10-CM

## 2021-12-12 DIAGNOSIS — Z6832 Body mass index (BMI) 32.0-32.9, adult: Secondary | ICD-10-CM

## 2022-02-03 ENCOUNTER — Ambulatory Visit: Payer: 59 | Admitting: Family Medicine

## 2022-02-03 ENCOUNTER — Ambulatory Visit (INDEPENDENT_AMBULATORY_CARE_PROVIDER_SITE_OTHER): Payer: 59

## 2022-02-03 ENCOUNTER — Encounter: Payer: Self-pay | Admitting: Family Medicine

## 2022-02-03 VITALS — BP 105/57 | HR 84 | Ht 63.0 in | Wt 179.0 lb

## 2022-02-03 DIAGNOSIS — M79669 Pain in unspecified lower leg: Secondary | ICD-10-CM

## 2022-02-03 DIAGNOSIS — M79604 Pain in right leg: Secondary | ICD-10-CM | POA: Diagnosis not present

## 2022-02-03 MED ORDER — CYCLOBENZAPRINE HCL 10 MG PO TABS: 10.0000 mg | ORAL_TABLET | Freq: Every evening | ORAL | 0 refills | Status: DC | PRN

## 2022-02-03 MED ORDER — PREDNISONE 20 MG PO TABS: 40.0000 mg | ORAL_TABLET | Freq: Every day | ORAL | 0 refills | Status: DC

## 2022-02-03 NOTE — Progress Notes (Signed)
Acute Office Visit  Subjective:     Patient ID: [name], female    DOB: [birth year], 46 y.o.   MRN: [id number]  Chief Complaint  Patient presents with   Knee Pain    Right knee pain and tingling. Right arm pain    HPI Patient is in today for Right leg pain- has been numb and tingling for the last 2 days.  Denies any specific injury or trauma to started coming on yesterday while she was sitting at her desk working.  By the time she got in her car she actually ended up having to pull off of the road because the pain was so bad and had to get her husband to come get her.  Using heating pad. Has noticed a sharp pain when she walks.  Pain 7/10.  Also having right arm pain.  No other unusual symptoms such as fever, chest pain, or shortness of breath.  No personal history of blood clots but her brother has a history of blood clots.  She feels like the pain is radiating from her right lateral thigh down to her ankle.  But she has had numbness and tingling on and off in her right anterior thigh for several weeks.  She denies any recent or old back pain.  She does notice that the pain in the leg is worse when she bends forward.  ROS      Objective:    BP (!) 105/57 (BP Location: Left Arm, Patient Position: Sitting, Cuff Size: Large)   Pulse 84   Ht '5\' 3"'$  (1.6 m)   Wt 179 lb (81.2 kg)   SpO2 100%   BMI 31.71 kg/m    Physical Exam Vitals and nursing note reviewed.  Constitutional:      Appearance: She is well-developed.  HENT:     Head: Normocephalic and atraumatic.  Cardiovascular:     Rate and Rhythm: Normal rate and regular rhythm.     Heart sounds: Normal heart sounds.  Pulmonary:     Effort: Pulmonary effort is normal.     Breath sounds: Normal breath sounds.  Skin:    General: Skin is warm and dry.     Comments: No significant pain with straight leg raise but she does have pain in the calf area with dorsiflexion as well as some tenderness over the lateral calf  area.  She is also tender over the right lateral thigh towards the knee.  Nontender over the knee joint itself.  She is also a little bit tender underneath her right upper arm between the axilla and the elbow.  No redness swelling or induration.  No significant swelling in the right lower extremity.  Neurological:     Mental Status: She is alert and oriented to person, place, and time.  Psychiatric:        Behavior: Behavior normal.     No results found for any visits on 02/03/22.      Assessment & Plan:   Problem List Items Addressed This Visit   None Visit Diagnoses     Right leg pain    -  Primary   Relevant Orders   US Venous Img Lower Unilateral Right   CBC with Differential/Platelet   COMPLETE METABOLIC PANEL WITH GFR   Calf tenderness       Relevant Orders   US Venous Img Lower Unilateral Right   CBC with Differential/Platelet   COMPLETE METABOLIC PANEL WITH GFR  No orders of the defined types were placed in this encounter.   No follow-ups on file.  Beatrice Lecher, MD

## 2022-02-03 NOTE — Progress Notes (Signed)
Hi Yuliana, no sign of DVT which is really reassuring.  So I think it really could be more musculoskeletal.  I would like to do a round of prednisone to see if this helps with the pain and discomfort also send for muscle relaxer which you can take in the evening.  Muscle relaxers can be sedating so please do not take and drive.  Reserve for once you are home in the evening.  We will see if this improves over the next several days but if it does not then please let us know.

## 2022-02-04 ENCOUNTER — Ambulatory Visit (INDEPENDENT_AMBULATORY_CARE_PROVIDER_SITE_OTHER): Payer: 59

## 2022-02-04 ENCOUNTER — Telehealth: Payer: Self-pay | Admitting: *Deleted

## 2022-02-04 DIAGNOSIS — M79604 Pain in right leg: Secondary | ICD-10-CM

## 2022-02-04 LAB — COMPLETE METABOLIC PANEL WITH GFR
AG Ratio: 1.6 (calc) (ref 1.0–2.5)
ALT: 9 U/L (ref 6–29)
AST: 9 U/L — ABNORMAL LOW (ref 10–35)
Albumin: 4 g/dL (ref 3.6–5.1)
Alkaline phosphatase (APISO): 67 U/L (ref 31–125)
BUN/Creatinine Ratio: 13 (calc) (ref 6–22)
BUN: 13 mg/dL (ref 7–25)
CO2: 28 mmol/L (ref 20–32)
Calcium: 8.8 mg/dL (ref 8.6–10.2)
Chloride: 103 mmol/L (ref 98–110)
Creat: 1 mg/dL — ABNORMAL HIGH (ref 0.50–0.99)
Globulin: 2.5 g/dL (calc) (ref 1.9–3.7)
Glucose, Bld: 80 mg/dL (ref 65–139)
Potassium: 4 mmol/L (ref 3.5–5.3)
Sodium: 138 mmol/L (ref 135–146)
Total Bilirubin: 0.4 mg/dL (ref 0.2–1.2)
Total Protein: 6.5 g/dL (ref 6.1–8.1)
eGFR: 70 mL/min/{1.73_m2} (ref >=60)

## 2022-02-04 LAB — CBC WITH DIFFERENTIAL/PLATELET
Absolute Monocytes: 424 cells/uL (ref 200–950)
Basophils Absolute: 39 cells/uL (ref 0–200)
Basophils Relative: 0.5 %
Eosinophils Absolute: 708 cells/uL — ABNORMAL HIGH (ref 15–500)
Eosinophils Relative: 9.2 %
HCT: 39.3 % (ref 35.0–45.0)
Hemoglobin: 13.2 g/dL (ref 11.7–15.5)
Lymphs Abs: 1686 cells/uL (ref 850–3900)
MCH: 28.3 pg (ref 27.0–33.0)
MCHC: 33.6 g/dL (ref 32.0–36.0)
MCV: 84.2 fL (ref 80.0–100.0)
MPV: 9.8 fL (ref 7.5–12.5)
Monocytes Relative: 5.5 %
Neutro Abs: 4843 cells/uL (ref 1500–7800)
Neutrophils Relative %: 62.9 %
Platelets: 390 10*3/uL (ref 140–400)
RBC: 4.67 10*6/uL (ref 3.80–5.10)
RDW: 13.4 % (ref 11.0–15.0)
Total Lymphocyte: 21.9 %
WBC: 7.7 10*3/uL (ref 3.8–10.8)

## 2022-02-04 NOTE — Telephone Encounter (Signed)
Orders Placed This Encounter  Procedures   DG Lumbar Spine Complete    Standing Status:   Future    Standing Expiration Date:   02/05/2023    Order Specific Question:   Reason for Exam (SYMPTOM  OR DIAGNOSIS REQUIRED)    Answer:   right radiculopathy    Order Specific Question:   Is patient pregnant?    Answer:   No    Order Specific Question:   Preferred imaging location?    Answer:   [facility]   DG Hip Unilat W OR W/O Pelvis 2-3 Views Right    Standing Status:   Future    Standing Expiration Date:   02/05/2023    Order Specific Question:   Reason for Exam (SYMPTOM  OR DIAGNOSIS REQUIRED)    Answer:   right leg pain radiating downward.    Order Specific Question:   Is patient pregnant?    Answer:   No    Order Specific Question:   Preferred imaging location?    Answer:   [facility]   Also can take 2 extra strength Tylenol up to 3 times a day with the prednisone for better pain control.  She is still rating her pain a 10 out of 10 today.  He did take a Flexeril last night but is not sure if it was really helpful or not.

## 2022-02-04 NOTE — Telephone Encounter (Signed)
Spoke w/pt she stated that she is in more pain than what she was in from yesterday when she was seen in our office. She would like to get a referral to neurology.  I asked her about what exactly is bothering her. Sh said that it is her R leg at the top of her leg at the hip at the R side and the pain goes down the side of her leg. She stated that the prednisone is not helping her at all. I advised her that she has to give the medication time to work and its only been 24 hours. Told her that she can take ES tylenol with the Prednisone but not IBU. She would like some xrays done to see if there is something else going on.  Pt was advised that her appt with her pcp was moved from 12/19 to 12/12   Will fwd this to Dr. Madilyn Fireman for imaging orders.

## 2022-02-05 ENCOUNTER — Encounter: Payer: Self-pay | Admitting: Family Medicine

## 2022-02-05 ENCOUNTER — Other Ambulatory Visit: Payer: Self-pay | Admitting: Family Medicine

## 2022-02-05 ENCOUNTER — Ambulatory Visit (INDEPENDENT_AMBULATORY_CARE_PROVIDER_SITE_OTHER): Payer: 59 | Admitting: Family Medicine

## 2022-02-05 VITALS — BP 112/73 | HR 87

## 2022-02-05 DIAGNOSIS — R350 Frequency of micturition: Secondary | ICD-10-CM

## 2022-02-05 LAB — POCT URINALYSIS DIP (CLINITEK)
Bilirubin, UA: NEGATIVE
Glucose, UA: 500 mg/dL — AB
Ketones, POC UA: NEGATIVE mg/dL
Leukocytes, UA: NEGATIVE
Nitrite, UA: NEGATIVE
POC PROTEIN,UA: 30 — AB
Spec Grav, UA: 1.025 (ref 1.010–1.025)
Urobilinogen, UA: 1 E.U./dL
pH, UA: 6 (ref 5.0–8.0)

## 2022-02-05 MED ORDER — TRAMADOL HCL 50 MG PO TABS: 50.0000 mg | ORAL_TABLET | Freq: Three times a day (TID) | ORAL | 0 refills | Status: AC | PRN

## 2022-02-05 NOTE — Progress Notes (Signed)
Low AST is not concerning.  I would like for her to come by and do a urinalysis though if she has been having some urinary symptoms.

## 2022-02-05 NOTE — Progress Notes (Signed)
Orders Placed This Encounter  Procedures   Urine Culture   Urine Microscopic   POCT URINALYSIS DIP (CLINITEK)   Urinalysis showed greater than 500 glucose in the urine as well as some blood.  Hemoglobin A1c performed today.  In the normal range so not worrisome for diabetes.  She has been on some prednisone and is currently on Ozempic for weight loss.  With blood in the urine we will also send for microscopic review to account number of whole red blood cells.

## 2022-02-05 NOTE — Progress Notes (Signed)
Patient presented today to give a urine sample per Dr. Madilyn Fireman and during her nurse visit we got a A1c and checked her vitals everything was stable and I told her we will call her when we get her urine culture results back patient understood.

## 2022-02-05 NOTE — Progress Notes (Signed)
Hi [name], your eosinophils are little bit high.  This usually is from things like allergies.  I do not think this is related to the pain that you are having.  Your kidney function is up just slightly normally you are around 0.7-0.9.  This time it was 1.0.  Are you having any issues with urination?

## 2022-02-06 LAB — URINALYSIS, MICROSCOPIC ONLY

## 2022-02-08 ENCOUNTER — Other Ambulatory Visit: Payer: Self-pay | Admitting: Obstetrics & Gynecology

## 2022-02-08 DIAGNOSIS — Z1231 Encounter for screening mammogram for malignant neoplasm of breast: Secondary | ICD-10-CM

## 2022-02-08 LAB — URINE CULTURE
MICRO NUMBER:: 14293606
SPECIMEN QUALITY:: ADEQUATE

## 2022-02-08 MED ORDER — NITROFURANTOIN MONOHYD MACRO 100 MG PO CAPS: 100.0000 mg | ORAL_CAPSULE | Freq: Two times a day (BID) | ORAL | 0 refills | Status: DC

## 2022-02-08 NOTE — Progress Notes (Signed)
Hi [name], x-ray of the hip is unremarkable nothing to explain your leg pain.  No significant arthritis.

## 2022-02-08 NOTE — Progress Notes (Signed)
[name], x-ray of the low back looks good as well.  Normal alignment and vertebral heights so no sign of major disc degeneration or herniation which is very reassuring.  I am hoping that you are actually feeling a little better after the weekend.  Please see the note regarding your urine sample it does look like you have a urinary tract infection.

## 2022-02-08 NOTE — Progress Notes (Signed)
Hi [name], it looks like your urine culture came back positive for UTI some good guidance on different antibiotics to get that cleared up.  Please try to pick it up today and get started on it today if you can.

## 2022-02-08 NOTE — Addendum Note (Signed)
Addended by: Beatrice Lecher D on: 02/08/2022 09:19 AM   Modules accepted: Orders

## 2022-02-09 ENCOUNTER — Encounter: Payer: Self-pay | Admitting: Medical-Surgical

## 2022-02-09 ENCOUNTER — Ambulatory Visit: Payer: 59 | Admitting: Medical-Surgical

## 2022-02-09 VITALS — BP 99/65 | HR 82 | Resp 20 | Ht 63.0 in | Wt 182.1 lb

## 2022-02-09 DIAGNOSIS — M79604 Pain in right leg: Secondary | ICD-10-CM | POA: Diagnosis not present

## 2022-02-09 DIAGNOSIS — N3000 Acute cystitis without hematuria: Secondary | ICD-10-CM

## 2022-02-09 DIAGNOSIS — Z6832 Body mass index (BMI) 32.0-32.9, adult: Secondary | ICD-10-CM | POA: Diagnosis not present

## 2022-02-09 DIAGNOSIS — E6609 Other obesity due to excess calories: Secondary | ICD-10-CM | POA: Diagnosis not present

## 2022-02-09 MED ORDER — GABAPENTIN 100 MG PO CAPS: 100.0000 mg | ORAL_CAPSULE | Freq: Three times a day (TID) | ORAL | 3 refills | Status: DC

## 2022-02-09 NOTE — Progress Notes (Unsigned)
Established Patient Office Visit  Subjective   Patient ID: [name], female   DOB: [birth year] Age: 46 y.o. MRN: [id number]   Chief Complaint  Patient presents with   Weight Check   HPI Pleasant 46 year old female presenting today for:  Weight: Taking Ozempic '1mg'$  weekly, tolerating well without side effects. Not exercising at this point due to recent health concerns. In the last year went from 195lbs to 179lbs but has gained a few pounds back is is currently at 182lbs today.   UTI: was found to have a UTI and started on Macrobid. She picked this up over the weekend and is currently on day two of the course.   Right leg pain/paresthesias: saw Dr. Madilyn Fireman for right lateral hip pain that wraps around to the anterior right thigh and extends down the leg to the mid shin. No pain or paresthesias reached the foot. Was negative for DVT. Treated with prednisone burst and reports pain and tingling went away. She has been off the steroid a couple of days and the pain is still gone but the numbness and tingling has started to return. Considers the symptoms as minimal to mild right now but is scared that the pain will return. No longer taking pain medication as the Tramadol seemed too strong for her. Requested a referral to neuro and that has already been placed.    Objective:    Vitals:   02/09/22 1310  BP: 99/65  Pulse: 82  Resp: 20  Height: '5\' 3"'$  (1.6 m)  Weight: 182 lb 1.3 oz (82.6 kg)  SpO2: 98%  BMI (Calculated): 32.26    Physical Exam Vitals and nursing note reviewed.  Constitutional:      General: She is not in acute distress.    Appearance: Normal appearance. She is not ill-appearing.  HENT:     Head: Normocephalic and atraumatic.  Cardiovascular:     Rate and Rhythm: Normal rate and regular rhythm.     Pulses: Normal pulses.     Heart sounds: Normal heart sounds.  Pulmonary:     Effort: Pulmonary effort is normal. No respiratory distress.     Breath sounds:  Normal breath sounds. No wheezing, rhonchi or rales.  Skin:    General: Skin is warm and dry.  Neurological:     Mental Status: She is alert and oriented to person, place, and time.  Psychiatric:        Mood and Affect: Mood normal.        Behavior: Behavior normal.        Thought Content: Thought content normal.        Judgment: Judgment normal.   No results found for this or any previous visit (from the past 24 hour(s)).     The 10-year ASCVD risk score (Arnett DK, et al., 2019) is: 0.3%   Values used to calculate the score:     Age: 39 years     Sex: Female     Is Non-Hispanic African American: Yes     Diabetic: No     Tobacco smoker: No     Systolic Blood Pressure: 99 mmHg     Is BP treated: No     HDL Cholesterol: 58 mg/dL     Total Cholesterol: 140 mg/dL   Assessment & Plan:   1. Right leg pain Referral in place to Neurology for further investigation of right leg symptoms. Referring to physical therapy.  - Ambulatory referral to Physical Therapy  2. Class  1 obesity due to excess calories without serious comorbidity with body mass index (BMI) of 32.0 to 32.9 in adult Has done well on Ozempic. Continue '1mg'$  weekly dosing. Exercise as tolerated. Recommend healthy choices and portion control.   3. Acute cystitis without hematuria Continue Macrobid as prescribed until the course is completed.   Return in about 6 weeks (around 03/23/2022) for right leg pain follow up with Dr. Darene Lamer.  ___________________________________________ Clearnce Sorrel, DNP, APRN, FNP-BC [facility] [place]

## 2022-02-11 ENCOUNTER — Other Ambulatory Visit: Payer: Self-pay

## 2022-02-11 ENCOUNTER — Ambulatory Visit: Payer: 59 | Attending: Medical-Surgical | Admitting: Physical Therapy

## 2022-02-11 ENCOUNTER — Encounter: Payer: Self-pay | Admitting: Physical Therapy

## 2022-02-11 DIAGNOSIS — R29898 Other symptoms and signs involving the musculoskeletal system: Secondary | ICD-10-CM | POA: Diagnosis not present

## 2022-02-11 DIAGNOSIS — M79604 Pain in right leg: Secondary | ICD-10-CM | POA: Diagnosis not present

## 2022-02-11 DIAGNOSIS — M6281 Muscle weakness (generalized): Secondary | ICD-10-CM | POA: Diagnosis present

## 2022-02-11 NOTE — Therapy (Signed)
OUTPATIENT PHYSICAL THERAPY LOWER EXTREMITY EVALUATION   Patient Name: [name] MRN: [id number] DOB:[birth year], 46 y.o., female Today's Date: 02/11/2022  END OF SESSION:  PT End of Session - 02/11/22 0943     Visit Number 1    Number of Visits 12    Date for PT Re-Evaluation 03/25/22    PT Start Time 0848    PT Stop Time 0925    PT Time Calculation (min) 37 min    Activity Tolerance Patient tolerated treatment well    Behavior During Therapy [facility] for tasks assessed/performed             Past Medical History:  Diagnosis Date   Allergy    Past Surgical History:  Procedure Laterality Date   CESAREAN SECTION  2007   CESAREAN SECTION  2012   WISDOM TOOTH EXTRACTION Bilateral 1998   Patient Active Problem List   Diagnosis Date Noted   Constipation 01/08/2013   Class 1 obesity due to excess calories without serious comorbidity with body mass index (BMI) of 32.0 to 32.9 in adult 01/08/2013    PCP: Not provided  REFERRING PROVIDER: Samuel Bouche  REFERRING DIAG: Right leg pain  THERAPY DIAG:  Muscle weakness (generalized)  Other symptoms and signs involving the musculoskeletal system  Rationale for Evaluation and Treatment: Rehabilitation  ONSET DATE: 11/2021  SUBJECTIVE:   SUBJECTIVE STATEMENT: Pt reports that around October she began having tingling in her upper thigh. In the past few weeks she has been having pain and tingling staring in her Rt hip and going down to her ankle. Tingling initially increased with walking and forward flexion, pt then was prescribed medication and it helped. She is now off of the meds. Now pain can come on at any time without being provoked. She states she feels her leg is "asleep".   PERTINENT HISTORY: UTI PAIN:  Are you having pain? Yes: NPRS scale: 1/10 Pain location: right thigh Pain description: tingling Aggravating factors: none reported Relieving factors: none reported  PRECAUTIONS: None  WEIGHT BEARING  RESTRICTIONS: No  FALLS:  Has patient fallen in last 6 months? No  OCCUPATION: office work  PLOF: Independent  PATIENT GOALS: reduce tingling  NEXT MD VISIT:   OBJECTIVE:   DIAGNOSTIC FINDINGS: Lumbar spine x ray negative, pelvis and hip x ray negative   SENSATION: Tingling Rt thigh  MUSCLE LENGTH: Thomas test: Right positive deg   PALPATION: No change in symptoms with hip distraction No TTP hip flexors, glutes, hip joint on Rt  LOWER EXTREMITY ROM:  Active ROM Right eval Left eval  Hip flexion    Hip extension    Hip abduction    Hip adduction    Hip internal rotation WFL - increased pain   Hip external rotation WFL - increased pain   Knee flexion    Knee extension    Ankle dorsiflexion    Ankle plantarflexion    Ankle inversion    Ankle eversion     (Blank rows = not tested)  LOWER EXTREMITY MMT:  MMT Right eval Left eval  Hip flexion 4- 4  Hip extension 4 4+  Hip abduction 4 4+  Hip adduction    Hip internal rotation    Hip external rotation    Knee flexion    Knee extension    Ankle dorsiflexion    Ankle plantarflexion    Ankle inversion    Ankle eversion     (Blank rows = not tested)  LOWER EXTREMITY  SPECIAL TESTS:  SLR negative   TODAY'S TREATMENT:                                                                                                                              DATE: 02/11/22 See HEP    PATIENT EDUCATION:  Education details: PT POC and goals, HEP Person educated: Patient Education method: Explanation, Demonstration, and Handouts Education comprehension: verbalized understanding and returned demonstration  HOME EXERCISE PROGRAM: Access Code: [email or web address] URL: [email or web address] Date: 02/11/2022 Prepared by: Isabelle Course  Exercises - Hip Flexor Stretch at [facility] of Bed  - 1 x daily - 7 x weekly - 1 sets - 3 reps - 20-30 seconds hold - Seated Hip Flexor Stretch  - 1 x daily - 7 x weekly - 1 sets - 3  reps - 20-30 seconds hold - Supine Bridge with Mini Swiss Ball Between Knees  - 1 x daily - 7 x weekly - 3 sets - 10 reps - Bridge with Hip Abduction and Resistance  - 1 x daily - 7 x weekly - 3 sets - 10 reps  ASSESSMENT:  CLINICAL IMPRESSION: Patient is a 46 y.o. female who was seen today for physical therapy evaluation and treatment for right leg pain. Pt presents with impaired sensation, decreased strength and flexibility and will benefit from skilled PT to address deficits and return toward prior level of function.   OBJECTIVE IMPAIRMENTS: decreased ROM, decreased strength, impaired flexibility, and impaired sensation.   ACTIVITY LIMITATIONS: locomotion level  PARTICIPATION LIMITATIONS: community activity and occupation  PERSONAL FACTORS: Time since onset of injury/illness/exacerbation are also affecting patient's functional outcome.   REHAB POTENTIAL: Good  CLINICAL DECISION MAKING: Evolving/moderate complexity  EVALUATION COMPLEXITY: Moderate   GOALS: Goals reviewed with patient? Yes  SHORT TERM GOALS: Target date: 02/25/2022   Pt will be independent with initial HEP Baseline: Goal status: INITIAL    LONG TERM GOALS: Target date: 03/25/2022    Pt will be independent in advanced HEP Baseline:  Goal status: INITIAL  2.  Pt will report symptom reduction by 50% Baseline:  Goal status: INITIAL  3.  Pt will improve LE strength to 4+/5 to return to walking and working out Baseline:  Goal status: INITIAL    PLAN:  PT FREQUENCY: 1-2x/week  PT DURATION: 6 weeks  PLANNED INTERVENTIONS: Therapeutic exercises, Therapeutic activity, Neuromuscular re-education, Balance training, Gait training, Patient/Family education, Self Care, Joint mobilization, Aquatic Therapy, Dry Needling, Electrical stimulation, Cryotherapy, Moist heat, Taping, Traction, Ultrasound, Ionotophoresis '4mg'$ /ml Dexamethasone, Manual therapy, and Re-evaluation  PLAN FOR NEXT SESSION: assess and  progress HEP for core strength and hip flexor and quad flexibility   Urvi Imes, PT 02/11/2022, 9:43 AM

## 2022-02-16 ENCOUNTER — Ambulatory Visit: Payer: 59 | Admitting: Medical-Surgical

## 2022-02-19 ENCOUNTER — Encounter: Payer: Self-pay | Admitting: Physical Therapy

## 2022-02-19 ENCOUNTER — Ambulatory Visit: Payer: 59 | Admitting: Physical Therapy

## 2022-02-19 DIAGNOSIS — R29898 Other symptoms and signs involving the musculoskeletal system: Secondary | ICD-10-CM

## 2022-02-19 DIAGNOSIS — M79604 Pain in right leg: Secondary | ICD-10-CM | POA: Diagnosis not present

## 2022-02-19 DIAGNOSIS — M6281 Muscle weakness (generalized): Secondary | ICD-10-CM

## 2022-02-19 NOTE — Therapy (Signed)
OUTPATIENT PHYSICAL THERAPY    Patient Name: [name] MRN: [id number] DOB:09-Mar-1975, 46 y.o., female Today's Date: 02/19/2022  END OF SESSION:  PT End of Session - 02/19/22 5397     Visit Number 2    Number of Visits 12    Date for PT Re-Evaluation 03/25/22    PT Start Time 0845    PT Stop Time 0923    PT Time Calculation (min) 38 min    Activity Tolerance Patient tolerated treatment well    Behavior During Therapy [facility] for tasks assessed/performed              Past Medical History:  Diagnosis Date   Allergy    Past Surgical History:  Procedure Laterality Date   CESAREAN SECTION  2007   CESAREAN SECTION  2012   WISDOM TOOTH EXTRACTION Bilateral 1998   Patient Active Problem List   Diagnosis Date Noted   Constipation 01/08/2013   Class 1 obesity due to excess calories without serious comorbidity with body mass index (BMI) of 32.0 to 32.9 in adult 01/08/2013    PCP: Not provided  REFERRING PROVIDER: Samuel Bouche  REFERRING DIAG: Right leg pain  THERAPY DIAG:  Muscle weakness (generalized)  Other symptoms and signs involving the musculoskeletal system  Rationale for Evaluation and Treatment: Rehabilitation  ONSET DATE: 11/2021  SUBJECTIVE:   SUBJECTIVE STATEMENT: Pt states she is feeling "ok". She is still taking meds at night. She hopes to return to the gym  PERTINENT HISTORY: UTI PAIN:  Are you having pain? Yes: NPRS scale: 1/10 Pain location: right thigh Pain description: tingling Aggravating factors: none reported Relieving factors: none reported  PRECAUTIONS: None  WEIGHT BEARING RESTRICTIONS: No  FALLS:  Has patient fallen in last 6 months? No  OCCUPATION: office work  PLOF: Independent  PATIENT GOALS: reduce tingling    OBJECTIVE:   LOWER EXTREMITY ROM:  Active ROM Right eval Left eval  Hip flexion    Hip extension    Hip abduction    Hip adduction    Hip internal rotation WFL - increased pain   Hip  external rotation WFL - increased pain   Knee flexion    Knee extension    Ankle dorsiflexion    Ankle plantarflexion    Ankle inversion    Ankle eversion     (Blank rows = not tested)  LOWER EXTREMITY MMT:  MMT Right eval Left eval  Hip flexion 4- 4  Hip extension 4 4+  Hip abduction 4 4+  Hip adduction    Hip internal rotation    Hip external rotation    Knee flexion    Knee extension    Ankle dorsiflexion    Ankle plantarflexion    Ankle inversion    Ankle eversion     (Blank rows = not tested)   TODAY'S TREATMENT:  [facility] Adult PT Treatment:                                                DATE: 02/19/22 Therapeutic Exercise: Treadmill 2.99mh x 5 min Supine hip flexor stretch 3 x 30 sec Bridge with ball squeeze x 20 Bridge with hip abd x 20 Prone femoral nerve glide x 15 Hip ext 2 x 10 5# Hip abd 2 x 10 5# Sit <> stand 10# KB 2 x 10 Resisted walking backward 15# x 10   DATE: 02/11/22 See HEP    PATIENT EDUCATION:  Education details: PT POC and goals, HEP Person educated: Patient Education method: Explanation, Demonstration, and Handouts Education comprehension: verbalized understanding and returned demonstration  HOME EXERCISE PROGRAM: Access Code: [email or web address]: [email or web address] Date: 02/11/2022 Prepared by: KIsabelle Course Exercises - [facility]  - 1 x daily - 7 x weekly - 1 sets - 3 reps - 20-30 seconds hold - Seated Hip Flexor Stretch  - 1 x daily - 7 x weekly - 1 sets - 3 reps - 20-30 seconds hold - Supine Bridge with Mini Swiss Ball Between Knees  - 1 x daily - 7 x weekly - 3 sets - 10 reps - Bridge with Hip Abduction and Resistance  - 1 x daily - 7 x weekly - 3 sets - 10 reps  ASSESSMENT:  CLINICAL IMPRESSION: Pt with significant tightness with femoral nerve glide, good response  to supine hip flexor stretching. No increase in symptoms with progression of strengthening this visit  OBJECTIVE IMPAIRMENTS: decreased ROM, decreased strength, impaired flexibility, and impaired sensation.    GOALS: Goals reviewed with patient? Yes  SHORT TERM GOALS: Target date: 02/25/2022   Pt will be independent with initial HEP Baseline: Goal status: INITIAL    LONG TERM GOALS: Target date: 03/25/2022    Pt will be independent in advanced HEP Baseline:  Goal status: INITIAL  2.  Pt will report symptom reduction by 50% Baseline:  Goal status: INITIAL  3.  Pt will improve LE strength to 4+/5 to return to walking and working out Baseline:  Goal status: INITIAL    PLAN:  PT FREQUENCY: 1-2x/week  PT DURATION: 6 weeks  PLANNED INTERVENTIONS: Therapeutic exercises, Therapeutic activity, Neuromuscular re-education, Balance training, Gait training, Patient/Family education, Self Care, Joint mobilization, Aquatic Therapy, Dry Needling, Electrical stimulation, Cryotherapy, Moist heat, Taping, Traction, Ultrasound, Ionotophoresis '4mg'$ /ml Dexamethasone, Manual therapy, and Re-evaluation  PLAN FOR NEXT SESSION: assess and progress HEP for core strength and hip flexor and quad flexibility, try quadruped exercises?   Tuyet Bader, PT 02/19/2022, 9:22 AM

## 2022-02-26 ENCOUNTER — Ambulatory Visit: Payer: 59

## 2022-02-26 DIAGNOSIS — R29898 Other symptoms and signs involving the musculoskeletal system: Secondary | ICD-10-CM

## 2022-02-26 DIAGNOSIS — M79604 Pain in right leg: Secondary | ICD-10-CM | POA: Diagnosis not present

## 2022-02-26 DIAGNOSIS — M6281 Muscle weakness (generalized): Secondary | ICD-10-CM

## 2022-02-26 NOTE — Therapy (Addendum)
OUTPATIENT PHYSICAL THERAPY TREATMENT AND DISCHARGE   Patient Name: [name] MRN: [id number] DOB:1975/09/17, 46 y.o., female Today's Date: 02/26/2022  END OF SESSION:  PT End of Session - 02/26/22 0759     Visit Number 3    Number of Visits 12    Date for PT Re-Evaluation 03/25/22    PT Start Time 0800    PT Stop Time 0848    PT Time Calculation (min) 48 min    Activity Tolerance Patient tolerated treatment well    Behavior During Therapy [facility] for tasks assessed/performed              Past Medical History:  Diagnosis Date   Allergy    Past Surgical History:  Procedure Laterality Date   CESAREAN SECTION  2007   CESAREAN SECTION  2012   WISDOM TOOTH EXTRACTION Bilateral 1998   Patient Active Problem List   Diagnosis Date Noted   Constipation 01/08/2013   Class 1 obesity due to excess calories without serious comorbidity with body mass index (BMI) of 32.0 to 32.9 in adult 01/08/2013    PCP: Not provided  REFERRING PROVIDER: Samuel Bouche  REFERRING DIAG: Right leg pain  THERAPY DIAG:  Muscle weakness (generalized)  Other symptoms and signs involving the musculoskeletal system  Rationale for Evaluation and Treatment: Rehabilitation  ONSET DATE: 11/2021  SUBJECTIVE:   SUBJECTIVE STATEMENT: Patient reports no pain today and states she has not had any tingling for a week. Patient states she has not been back to the gym due to holiday travels.  PERTINENT HISTORY: UTI PAIN:  Are you having pain? Yes: NPRS scale: 0/10 Pain location: right thigh Pain description: tingling Aggravating factors: none reported Relieving factors: none reported  PRECAUTIONS: None  WEIGHT BEARING RESTRICTIONS: No  FALLS:  Has patient fallen in last 6 months? No  OCCUPATION: office work  PLOF: Independent  PATIENT GOALS: reduce tingling    OBJECTIVE:   LOWER EXTREMITY ROM:  Active ROM Right eval Left eval  Hip flexion    Hip extension    Hip  abduction    Hip adduction    Hip internal rotation WFL - increased pain   Hip external rotation WFL - increased pain   Knee flexion    Knee extension    Ankle dorsiflexion    Ankle plantarflexion    Ankle inversion    Ankle eversion     (Blank rows = not tested)  LOWER EXTREMITY MMT:  MMT Right eval Left eval  Hip flexion 4- 4  Hip extension 4 4+  Hip abduction 4 4+  Hip adduction    Hip internal rotation    Hip external rotation    Knee flexion    Knee extension    Ankle dorsiflexion    Ankle plantarflexion    Ankle inversion    Ankle eversion     (Blank rows = not tested)   TODAY'S TREATMENT:    [facility] Adult PT Treatment:                                                DATE: 02/26/2022 Therapeutic Exercise: Treadmill warm-up 2.2 mph x 13mn S/L R femoral glide x10 Supine R hip flexor stretch w/strap 2x30" Hooklying hip add isometric (ball squeeze) 10x5" Bent knee fall out BTB x10 B Bridges + hip abd BTB 2x10 Prone  quad stretch w/strap 3x30"  S/L bent knee hip abd GTB x10 S/L clamshells x10 GTB Modified piriformis stretch  Pulleys: resisted bkwd amb 15# x5, side stepping 10# x5 B                                                                                                                              [facility] Adult PT Treatment:                                                DATE: 02/19/22 Therapeutic Exercise: Treadmill 2.65mh x 5 min Supine hip flexor stretch 3 x 30 sec Bridge with ball squeeze x 20 Bridge with hip abd x 20 Prone femoral nerve glide x 15 Hip ext 2 x 10 5# Hip abd 2 x 10 5# Sit <> stand 10# KB 2 x 10 Resisted walking backward 15# x 10   DATE: 02/11/22 See HEP    PATIENT EDUCATION:  Education details: Progress HEP Person educated: Patient Education method: Explanation, Demonstration, and Handouts Education comprehension: verbalized understanding and returned demonstration  HOME EXERCISE PROGRAM: Access Code: [email or web address]:  [email or web address] Date: 02/26/2022 Prepared by: KHelane Gunther Exercises - [facility] Flex[facility] St[facility]  - 1 x daily - 7 x weekly - 1 sets - 3 reps - 20-30 seconds hold - Seated Hip Flexor Stretch  - 1 x daily - 7 x weekly - 1 sets - 3 reps - 20-30 seconds hold - Supine Bridge with Mini Swiss Ball Between Knees  - 1 x daily - 7 x weekly - 3 sets - 10 reps - Bridge with Hip Abduction and Resistance  - 1 x daily - 7 x weekly - 3 sets - 10 reps - Prone Quadriceps Stretch with Strap  - 1 x daily - 7 x weekly - 3 sets - 10 reps - Side Stepping with Resistance at Ankles  - 1 x daily - 7 x weekly - 3 sets - 10 reps - Forward Monster Walks  - 1 x daily - 7 x weekly - 3 sets - 10 reps - Backward Monster Walks  - 1 x daily - 7 x weekly - 3 sets - 10 reps  ASSESSMENT:  CLINICAL IMPRESSION: Moderate tightness noted during passive femoral glide on right side. Functional hip strengthening progressed with resisted ambulation, utilizing resistance bands and pulley system. Discussion on establishing gym routine and getting introduction to weight machines.  OBJECTIVE IMPAIRMENTS: decreased ROM, decreased strength, impaired flexibility, and impaired sensation.    GOALS: Goals reviewed with patient? Yes  SHORT TERM GOALS: Target date: 02/25/2022   Pt will be independent with initial HEP Baseline: Goal status: INITIAL    LONG TERM GOALS: Target date: 03/25/2022    Pt will be independent in advanced HEP Baseline:  Goal status: INITIAL  2.  Pt will report symptom reduction by 50% Baseline:  Goal status: INITIAL  3.  Pt will improve LE strength to 4+/5 to return to walking and working out Baseline:  Goal status: INITIAL    PLAN:  PT FREQUENCY: 1-2x/week  PT DURATION: 6 weeks  PLANNED INTERVENTIONS: Therapeutic exercises, Therapeutic activity, Neuromuscular re-education, Balance training, Gait training, Patient/Family education, Self Care, Joint mobilization,  Aquatic Therapy, Dry Needling, Electrical stimulation, Cryotherapy, Moist heat, Taping, Traction, Ultrasound, Ionotophoresis '4mg'$ /ml Dexamethasone, Manual therapy, and Re-evaluation  PLAN FOR NEXT SESSION: Progress core strength and hip flexor and quad flexibility, try quadruped exercises?  PHYSICAL THERAPY DISCHARGE SUMMARY  Visits from Start of Care: 3  Current functional level related to goals / functional outcomes: Decreased pain and tingling   Remaining deficits: See above   Education / Equipment: HEP   Patient agrees to discharge. Patient goals were partially met. Patient is being discharged due to not returning since the last visit.  Isabelle Course, PT,DPT03/05/242:14 PM   Hardin Negus, PTA 02/26/2022, 8:48 AM

## 2022-03-23 ENCOUNTER — Encounter: Payer: 59 | Admitting: Sports Medicine

## 2022-05-11 ENCOUNTER — Ambulatory Visit: Payer: 59 | Admitting: Medical-Surgical

## 2022-05-11 ENCOUNTER — Encounter: Payer: Self-pay | Admitting: Medical-Surgical

## 2022-05-11 VITALS — BP 109/70 | HR 98 | Resp 20 | Ht 63.0 in | Wt 185.1 lb

## 2022-05-11 DIAGNOSIS — M79604 Pain in right leg: Secondary | ICD-10-CM

## 2022-05-11 MED ORDER — CYCLOBENZAPRINE HCL 10 MG PO TABS
5.0000 mg | ORAL_TABLET | Freq: Every evening | ORAL | 1 refills | Status: AC | PRN
Start: 2022-05-11 — End: ?

## 2022-05-11 MED ORDER — MELOXICAM 15 MG PO TABS: 15.0000 mg | ORAL_TABLET | Freq: Every day | ORAL | 1 refills | Status: DC

## 2022-05-11 NOTE — Progress Notes (Signed)
Established Patient Office Visit  Subjective   Patient ID: [name], female   DOB: 30-Jul-1975 Age: 47 y.o. MRN: [id number]   Chief Complaint  Patient presents with   Leg Pain    Right leg    HPI Pleasant 47 year old female presenting today with complaints of right leg pain.  She was seen for this in early December and again several days later.  She has had x-rays of the lumbar spine as well as the right hip.  Reports that the pain starts at her hip and goes down the leg.  Describes it as tingling and numb.  She was prescribed Flexeril, tramadol, and gabapentin which she took.  She tolerated all the medications well and notes that with the use of gabapentin, her pain completely resolved.  She still had some tramadol left after this.  Notes that she stopped taking the medications after her pain fully resolved but now the pain has come back.  She restarted the gabapentin approximately 5 days ago and has been taking 200 mg in the morning with 100 mg in the evening after work.  She has also been taking Aleve 2 tablets up to twice daily.  Unfortunately, her pain has not gotten better with the restart of medications.  She took tramadol last night which helped her get some sleep but she was a little groggy this morning.  She has completed at least 6 weeks of physical therapy and presents today wanting an MRI for further investigation.  Admits that she is quite frustrated with not knowing what is causing her symptoms and does not want to continue taking medication just to treat the symptoms without finding the cause.   Objective:    Vitals:   05/11/22 1542  BP: 109/70  Pulse: 98  Resp: 20  Height: '5\' 3"'$  (1.6 m)  Weight: 185 lb 1.6 oz (84 kg)  SpO2: 98%  BMI (Calculated): 32.8    Physical Exam Vitals reviewed.  Constitutional:      General: She is not in acute distress.    Appearance: Normal appearance. She is obese. She is not ill-appearing.  HENT:     Head: Normocephalic and  atraumatic.  Cardiovascular:     Rate and Rhythm: Normal rate and regular rhythm.     Pulses: Normal pulses.     Heart sounds: Normal heart sounds.  Pulmonary:     Effort: Pulmonary effort is normal. No respiratory distress.     Breath sounds: Normal breath sounds. No wheezing, rhonchi or rales.  Skin:    General: Skin is warm and dry.  Neurological:     Mental Status: She is alert and oriented to person, place, and time.  Psychiatric:        Mood and Affect: Mood normal.        Behavior: Behavior normal.        Thought Content: Thought content normal.        Judgment: Judgment normal.   No results found for this or any previous visit (from the past 24 hour(s)).     The 10-year ASCVD risk score (Arnett DK, et al., 2019) is: 0.4%   Values used to calculate the score:     Age: 68 years     Sex: Female     Is Non-Hispanic African American: Yes     Diabetic: No     Tobacco smoker: No     Systolic Blood Pressure: 0000000 mmHg     Is BP treated: No  HDL Cholesterol: 58 mg/dL     Total Cholesterol: 140 mg/dL   Assessment & Plan:   1. Right leg pain Unfortunately, there is no clear etiology for her symptoms.  Consider lumbar spinal etiology.  Suspect that an MRI would be helpful however we do need to determine the most likely cause.  Could also consider nerve conduction study.  For now, recommend getting discomfort under control with gabapentin 200 mg twice daily.  Sending in meloxicam 15 mg daily to replace Aleve.  Adding Flexeril 5-10 mg nightly as needed to help with sleep.  Would like her to see my partner Dr. Dianah Field for further investigation and management recommendations.  Advised her to schedule an appointment with him at her earliest convenience.  Return for further evaluation of right leg pain with Dr. Darene Lamer.  ___________________________________________ Clearnce Sorrel, DNP, APRN, FNP-BC [facility] [place]

## 2022-07-01 ENCOUNTER — Telehealth: Payer: Self-pay | Admitting: Medical-Surgical

## 2022-07-01 NOTE — Telephone Encounter (Signed)
Patient requests update/needs prior authorization for ;  [facility] 1MG /4MG  needs pa  Pharmacy is [facility] [place]  [phone]  Fax: [phone]

## 2022-07-07 ENCOUNTER — Telehealth: Payer: Self-pay | Admitting: Medical-Surgical

## 2022-07-07 NOTE — Telephone Encounter (Signed)
[facility] called on behalf of the patient requesting a refill of gabapentin (NEURONTIN) 100 MG capsule [[id number]] and meloxicam (MOBIC) 15 MG tablet [[id number]]   Pharmacy -  [facility] - [place] - [address]    Phone number [phone]

## 2022-07-08 ENCOUNTER — Other Ambulatory Visit: Payer: Self-pay | Admitting: Medical-Surgical

## 2022-07-08 ENCOUNTER — Telehealth: Payer: Self-pay | Admitting: Medical-Surgical

## 2022-07-08 DIAGNOSIS — Z0000 Encounter for general adult medical examination without abnormal findings: Secondary | ICD-10-CM

## 2022-07-08 DIAGNOSIS — Z131 Encounter for screening for diabetes mellitus: Secondary | ICD-10-CM

## 2022-07-08 DIAGNOSIS — Z13220 Encounter for screening for lipoid disorders: Secondary | ICD-10-CM

## 2022-07-08 MED ORDER — MELOXICAM 15 MG PO TABS
15.0000 mg | ORAL_TABLET | Freq: Every day | ORAL | 0 refills | Status: AC
Start: 2022-07-08 — End: ?

## 2022-07-08 MED ORDER — GABAPENTIN 100 MG PO CAPS: 100.0000 mg | ORAL_CAPSULE | Freq: Three times a day (TID) | ORAL | 0 refills | Status: DC

## 2022-07-08 NOTE — Telephone Encounter (Signed)
Patient request labs prior to CPE on 07/13/2022. Please order.

## 2022-07-08 NOTE — Telephone Encounter (Signed)
Patient advised.

## 2022-07-13 ENCOUNTER — Encounter: Payer: 59 | Admitting: Medical-Surgical

## 2022-07-22 ENCOUNTER — Telehealth: Payer: Self-pay

## 2022-07-22 NOTE — Telephone Encounter (Addendum)
Initiated Prior authorization RUE:[id number] 1mg pen-injectors Via: rxbpromptpa Case/Key:n/a Status: Pending denial as of 07/22/22 Reason:fax only Notified Pt via: Mychart  Prior Auth (EOC) ID: [id number] Drug/Service Name: OZEMPIC 2 MG/DOSE (8 MG/3 ML) Patient: [name] Date Requested: 08/30/2022 12:39:46 PM   MemberID: [id number] DOB: 04-27-1975

## 2022-08-02 ENCOUNTER — Encounter: Payer: Self-pay | Admitting: Medical-Surgical

## 2022-08-02 ENCOUNTER — Ambulatory Visit (INDEPENDENT_AMBULATORY_CARE_PROVIDER_SITE_OTHER): Payer: 59 | Admitting: Medical-Surgical

## 2022-08-02 VITALS — BP 103/70 | HR 96 | Resp 20 | Ht 63.0 in | Wt 181.3 lb

## 2022-08-02 DIAGNOSIS — Z1329 Encounter for screening for other suspected endocrine disorder: Secondary | ICD-10-CM

## 2022-08-02 DIAGNOSIS — Z0000 Encounter for general adult medical examination without abnormal findings: Secondary | ICD-10-CM

## 2022-08-02 DIAGNOSIS — Z6832 Body mass index (BMI) 32.0-32.9, adult: Secondary | ICD-10-CM

## 2022-08-02 DIAGNOSIS — Z131 Encounter for screening for diabetes mellitus: Secondary | ICD-10-CM | POA: Diagnosis not present

## 2022-08-02 DIAGNOSIS — E6609 Other obesity due to excess calories: Secondary | ICD-10-CM | POA: Diagnosis not present

## 2022-08-02 MED ORDER — SEMAGLUTIDE (2 MG/DOSE) 8 MG/3ML ~~LOC~~ SOPN: 2.0000 mg | PEN_INJECTOR | SUBCUTANEOUS | 1 refills | Status: DC

## 2022-08-02 NOTE — Progress Notes (Signed)
Complete physical exam  Patient: [name]   DOB: [birth year]   47 y.o. Female  MRN: [id number]  Subjective:    Chief Complaint  Patient presents with   Annual Exam   PAULETTA RISCH is a 47 y.o. female who presents today for a complete physical exam. She reports consuming a general diet.  Exercising when she can.   She generally feels well. She reports sleeping fairly well. She does not have additional problems to discuss today.   Most recent fall risk assessment:    05/11/2022    3:44 PM  Fall Risk   Falls in the past year? 0  Number falls in past yr: 0  Injury with Fall? 0  Risk for fall due to : No Fall Risks  Follow up Falls evaluation completed     Most recent depression screenings:    05/11/2022    3:44 PM 02/09/2022    1:12 PM  PHQ 2/9 Scores  PHQ - 2 Score 0 0    Vision:Within last year, Dental: No current dental problems and Receives regular dental care, and STD: The patient denies history of sexually transmitted disease.    Patient Care Team: Christen Butter, NP as PCP - General (Nurse Practitioner)   Outpatient Medications Prior to Visit  Medication Sig   cyclobenzaprine (FLEXERIL) 10 MG tablet Take 0.5-1 tablets (5-10 mg total) by mouth at bedtime as needed for muscle spasms. Caution: can cause drowsiness   gabapentin (NEURONTIN) 100 MG capsule Take 1 capsule (100 mg total) by mouth 3 (three) times daily.   meloxicam (MOBIC) 15 MG tablet Take 1 tablet (15 mg total) by mouth daily.   spironolactone (ALDACTONE) 50 MG tablet Take 50 mg by mouth daily.   [DISCONTINUED] OZEMPIC, 1 MG/DOSE, 4 MG/3ML SOPN INJECT SUBCUTANEOUSLY 1 MG EVERY WEEK   No facility-administered medications prior to visit.    Review of Systems  Constitutional:  Negative for chills, fever, malaise/fatigue and weight loss.  HENT:  Negative for congestion, ear pain, hearing loss, sinus pain and sore throat.   Eyes:  Negative for blurred vision, photophobia and pain.   Respiratory:  Negative for cough, shortness of breath and wheezing.   Cardiovascular:  Negative for chest pain, palpitations and leg swelling.  Gastrointestinal:  Negative for abdominal pain, constipation, diarrhea, heartburn, nausea and vomiting.  Genitourinary:  Negative for dysuria, frequency and urgency.  Musculoskeletal:  Negative for falls and neck pain.  Skin:  Negative for itching and rash.  Neurological:  Negative for dizziness, weakness and headaches.  Endo/Heme/Allergies:  Negative for polydipsia. Does not bruise/bleed easily.  Psychiatric/Behavioral:  Negative for depression, substance abuse and suicidal ideas. The patient is not nervous/anxious.      Objective:    BP 103/70 (BP Location: Left Arm, Cuff Size: Normal)   Pulse 96   Resp 20   Ht 5\' 3"  (1.6 m)   Wt 181 lb 4.8 oz (82.2 kg)   SpO2 100%   BMI 32.12 kg/m    Physical Exam Constitutional:      General: She is not in acute distress.    Appearance: Normal appearance. She is not ill-appearing.  HENT:     Head: Normocephalic and atraumatic.     Right Ear: Tympanic membrane, ear canal and external ear normal. There is no impacted cerumen.     Left Ear: Tympanic membrane, ear canal and external ear normal. There is no impacted cerumen.     Nose: Nose normal. No congestion or  rhinorrhea.     Mouth/Throat:     Mouth: Mucous membranes are moist.     Pharynx: No oropharyngeal exudate or posterior oropharyngeal erythema.  Eyes:     General: No scleral icterus.       Right eye: No discharge.        Left eye: No discharge.     Extraocular Movements: Extraocular movements intact.     Conjunctiva/sclera: Conjunctivae normal.     Pupils: Pupils are equal, round, and reactive to light.  Neck:     Thyroid: No thyromegaly.     Vascular: No carotid bruit or JVD.     Trachea: Trachea normal.  Cardiovascular:     Rate and Rhythm: Normal rate and regular rhythm.     Pulses: Normal pulses.     Heart sounds: Normal heart  sounds. No murmur heard.    No friction rub. No gallop.  Pulmonary:     Effort: Pulmonary effort is normal. No respiratory distress.     Breath sounds: Normal breath sounds. No wheezing.  Abdominal:     General: Bowel sounds are normal. There is no distension.     Palpations: Abdomen is soft.     Tenderness: There is no abdominal tenderness. There is no guarding.  Musculoskeletal:        General: Normal range of motion.     Cervical back: Normal range of motion and neck supple.  Lymphadenopathy:     Cervical: No cervical adenopathy.  Skin:    General: Skin is warm and dry.  Neurological:     Mental Status: She is alert and oriented to person, place, and time.     Cranial Nerves: No cranial nerve deficit.  Psychiatric:        Mood and Affect: Mood normal.        Behavior: Behavior normal.        Thought Content: Thought content normal.        Judgment: Judgment normal.      No results found for any visits on 08/02/22.     Assessment & Plan:    Routine Health Maintenance and Physical Exam  Immunization History  Administered Date(s) Administered   Influenza Inj Mdck Quad Pf 12/31/2017   Influenza-Unspecified 12/02/2015, 12/19/2016, 12/20/2017, 12/15/2018   Moderna SARS-COV2 Booster Vaccination 01/02/2020   Moderna Sars-Covid-2 Vaccination 04/09/2019, 05/09/2019   Tdap 05/21/2010, 08/07/2020    Health Maintenance  Topic Date Due   COVID-19 Vaccine (4 - 2023-24 season) 08/18/2022 (Originally 10/30/2021)   Hepatitis C Screening  08/18/2022 (Originally 01/19/1994)   INFLUENZA VACCINE  09/30/2022   Colonoscopy  09/28/2024   PAP SMEAR-Modifier  10/19/2024   DTaP/Tdap/Td (3 - Td or Tdap) 08/08/2030   HPV VACCINES  Aged Out   HIV Screening  Discontinued    Discussed health benefits of physical activity, and encouraged her to engage in regular exercise appropriate for her age and condition.  1. Class 1 obesity due to excess calories without serious comorbidity with body  mass index (BMI) of 32.0 to 32.9 in adult No significant weight loss to date over the last year. Increasing Ozempic to 2mg  weekly. Recommend increase in exercise and working on dietary changes.   2. Annual physical exam Checking labs as below. UTD on preventative care. Wellness information provided with AVS. - Lipid panel - COMPLETE METABOLIC PANEL WITH GFR - CBC with Differential/Platelet  3. Diabetes mellitus screening Checking A1c. - Hemoglobin A1c  4. Thyroid disorder screen Checking TSH. - TSH  Return in about 6 months (around 02/01/2023) for weight check.   Christen Butter, NP

## 2022-08-03 LAB — HEMOGLOBIN A1C
Hgb A1c MFr Bld: 5.4 % of total Hgb (ref <5.7)
Mean Plasma Glucose: 108 mg/dL
eAG (mmol/L): 6 mmol/L

## 2022-08-03 LAB — CBC WITH DIFFERENTIAL/PLATELET
Absolute Monocytes: 403 cells/uL (ref 200–950)
Basophils Absolute: 61 cells/uL (ref 0–200)
Basophils Relative: 0.8 %
Eosinophils Absolute: 289 cells/uL (ref 15–500)
Eosinophils Relative: 3.8 %
HCT: 42.5 % (ref 35.0–45.0)
Hemoglobin: 14.3 g/dL (ref 11.7–15.5)
Lymphs Abs: 2044 cells/uL (ref 850–3900)
MCH: 28.5 pg (ref 27.0–33.0)
MCHC: 33.6 g/dL (ref 32.0–36.0)
MCV: 84.8 fL (ref 80.0–100.0)
MPV: 9.2 fL (ref 7.5–12.5)
Monocytes Relative: 5.3 %
Neutro Abs: 4803 cells/uL (ref 1500–7800)
Neutrophils Relative %: 63.2 %
Platelets: 392 10*3/uL (ref 140–400)
RBC: 5.01 10*6/uL (ref 3.80–5.10)
RDW: 13 % (ref 11.0–15.0)
Total Lymphocyte: 26.9 %
WBC: 7.6 10*3/uL (ref 3.8–10.8)

## 2022-08-03 LAB — COMPLETE METABOLIC PANEL WITH GFR
AG Ratio: 1.4 (calc) (ref 1.0–2.5)
ALT: 8 U/L (ref 6–29)
AST: 12 U/L (ref 10–35)
Albumin: 4.4 g/dL (ref 3.6–5.1)
Alkaline phosphatase (APISO): 77 U/L (ref 31–125)
BUN: 12 mg/dL (ref 7–25)
CO2: 27 mmol/L (ref 20–32)
Calcium: 9.6 mg/dL (ref 8.6–10.2)
Chloride: 100 mmol/L (ref 98–110)
Creat: 0.87 mg/dL (ref 0.50–0.99)
Globulin: 3.1 g/dL (calc) (ref 1.9–3.7)
Glucose, Bld: 85 mg/dL (ref 65–99)
Potassium: 4.4 mmol/L (ref 3.5–5.3)
Sodium: 137 mmol/L (ref 135–146)
Total Bilirubin: 0.6 mg/dL (ref 0.2–1.2)
Total Protein: 7.5 g/dL (ref 6.1–8.1)
eGFR: 83 mL/min/{1.73_m2} (ref >=60)

## 2022-08-03 LAB — LIPID PANEL
Cholesterol: 180 mg/dL (ref <200)
HDL: 66 mg/dL (ref >=50)
LDL Cholesterol (Calc): 100 mg/dL (calc) — ABNORMAL HIGH
Non-HDL Cholesterol (Calc): 114 mg/dL (calc) (ref <130)
Total CHOL/HDL Ratio: 2.7 (calc) (ref <5.0)
Triglycerides: 56 mg/dL (ref <150)

## 2022-08-03 LAB — TSH: TSH: 3.18 mIU/L

## 2022-08-05 NOTE — Telephone Encounter (Signed)
Pt. Is calling office to check status of PA auth on Ozempic 1mg . Please advise.

## 2022-08-23 ENCOUNTER — Telehealth: Payer: Self-pay

## 2022-08-23 NOTE — Telephone Encounter (Signed)
[name] completed Biometric Screening form.  Faxed to [phone]  Fax confirmation successful  Copy sent to scan

## 2022-08-30 ENCOUNTER — Ambulatory Visit
Admission: RE | Admit: 2022-08-30 | Discharge: 2022-08-30 | Disposition: A | Discharge status: Home / Self Care | Payer: 59 | Source: Ambulatory Visit | Attending: Obstetrics & Gynecology | Admitting: Obstetrics & Gynecology

## 2022-08-30 DIAGNOSIS — Z1231 Encounter for screening mammogram for malignant neoplasm of breast: Secondary | ICD-10-CM

## 2022-09-17 ENCOUNTER — Other Ambulatory Visit: Payer: Self-pay | Admitting: Medical-Surgical

## 2022-09-30 ENCOUNTER — Telehealth: Payer: Self-pay | Admitting: Medical-Surgical

## 2022-09-30 NOTE — Telephone Encounter (Signed)
Patient called in wanting an update for a PA for Ozempic. Please Adivse.

## 2022-10-11 ENCOUNTER — Telehealth (INDEPENDENT_AMBULATORY_CARE_PROVIDER_SITE_OTHER): Payer: 59 | Admitting: Medical-Surgical

## 2022-10-11 ENCOUNTER — Telehealth: Payer: 59 | Admitting: Medical-Surgical

## 2022-10-11 ENCOUNTER — Encounter: Payer: Self-pay | Admitting: Medical-Surgical

## 2022-10-11 VITALS — Ht 63.0 in | Wt 193.3 lb

## 2022-10-11 DIAGNOSIS — E661 Drug-induced obesity: Secondary | ICD-10-CM | POA: Diagnosis not present

## 2022-10-11 DIAGNOSIS — Z7189 Other specified counseling: Secondary | ICD-10-CM

## 2022-10-11 DIAGNOSIS — Z6834 Body mass index (BMI) 34.0-34.9, adult: Secondary | ICD-10-CM | POA: Diagnosis not present

## 2022-10-11 MED ORDER — ZEPBOUND 2.5 MG/0.5ML ~~LOC~~ SOAJ
2.5000 mg | SUBCUTANEOUS | 0 refills | Status: AC
Start: 2022-10-11 — End: ?

## 2022-10-11 NOTE — Progress Notes (Signed)
Virtual Visit via Video Note  I connected with [name] on 10/11/22 at  3:00 PM EDT by a video enabled telemedicine application and verified that I am speaking with the correct person using two identifiers.   I discussed the limitations of evaluation and management by telemedicine and the availability of in person appointments. The patient expressed understanding and agreed to proceed.  Patient location: home Provider locations: office  Subjective:    CC: Discuss weight loss options  HPI: Pleasant 47 year old female presenting via MyChart video visit to discuss weight loss options.  Previously treated with Ozempic 2 mg weekly which was well-tolerated without side effects.  Felt that the medication worked to help reduce her appetite and was helping with weight loss.  Unfortunately, her insurance denied coverage in March and she had to come off the medication.  Today reports that she is interested in other options that may help.  Currently taking gabapentin and meloxicam and wonders if these medications may be the reason that she is feeling hungry all the time as well as bloated.  Reports that she has gum nearby and uses that to chew whenever she gets hungry at times.  Today, ate lunch around 1:15 PM however was hungry again with in 2 hours.  Notes that whenever she is hungry, her first reaction is to eat and she has not tried other responses (drinking water, exercise, distraction, etc.).  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  Has tried phentermine and Topamax in the past.  These did help with appetite suppression but she is no longer on these medications.   Past medical history, Surgical history, Family history not pertinant except as noted below, Social history, Allergies, and medications have been entered into the medical record, reviewed, and corrections made.   Review of  Systems: See HPI for pertinent positives and negatives.   Objective:    General: Speaking clearly in complete sentences without any shortness of breath.  Alert and oriented x3.  Normal judgment. No apparent acute distress.  Impression and Recommendations:    1. Class 1 drug-induced obesity without serious comorbidity with body mass index (BMI) of 34.0 to 34.9 in adult Previously tolerated Ozempic well however discussed that she has not type II diabetic and we have no record of prediabetes on her chart.  With new insurance requirements, we will be unable to continue Ozempic for her.  We did discuss other options including Wegovy, Zepbound, and Mounjaro.  Advised her to contact her insurance policy to verify if any of these are covered.  With her current BMI of 34.24, may be able to get something covered.  Trying for Zepbound 2.5 mg weekly to see if this will be in her formulary.  Discussed recommendations for regular intentional exercise.  We also discussed response to the hunger sensation.  Advised her to look at other options when she is feeling the sensation of hunger.  Reviewed other things that can cause hunger including boredom, stress/anxiety, thirst, fatigue, etc.  Recommend avoiding snacking or extra meals if she has had a nutritious meal within the last few hours. - tirzepatide (ZEPBOUND) 2.5 MG/0.5ML Pen; Inject 2.5 mg into the skin once a week.  Dispense: 2 mL; Refill: 0  I discussed the assessment and treatment plan with the patient. The patient was provided an opportunity to ask questions and all were answered. The patient agreed with  the plan and demonstrated an understanding of the instructions.   The patient was advised to call back or seek an in-person evaluation if the symptoms worsen or if the condition fails to improve as anticipated.  25 minutes of non-face-to-face time was provided during this encounter.  Return for Weight loss follow-up in 4 weeks if able to get Zepbound  covered.  Thayer Ohm, DNP, APRN, FNP-BC [facility]

## 2022-10-12 ENCOUNTER — Other Ambulatory Visit: Payer: Self-pay

## 2022-10-12 ENCOUNTER — Telehealth: Payer: Self-pay

## 2022-10-12 NOTE — Telephone Encounter (Signed)
Sent PA for Zepbound to [employer] August 13,2024

## 2022-10-13 ENCOUNTER — Encounter: Payer: Self-pay | Admitting: Medical-Surgical

## 2022-10-15 NOTE — Telephone Encounter (Signed)
Patient informed and voiced understanding - she  will await the zepboung PA results.

## 2022-10-21 ENCOUNTER — Ambulatory Visit (INDEPENDENT_AMBULATORY_CARE_PROVIDER_SITE_OTHER): Payer: 59 | Admitting: Radiology

## 2022-10-21 ENCOUNTER — Encounter: Payer: Self-pay | Admitting: Radiology

## 2022-10-21 VITALS — BP 130/86 | HR 100 | Ht 63.5 in | Wt 206.0 lb

## 2022-10-21 DIAGNOSIS — Z975 Presence of (intrauterine) contraceptive device: Secondary | ICD-10-CM

## 2022-10-21 DIAGNOSIS — Z01419 Encounter for gynecological examination (general) (routine) without abnormal findings: Secondary | ICD-10-CM

## 2022-10-21 NOTE — Progress Notes (Signed)
   [name] 01-Jul-1975 [id number]   History:  47 y.o. G2P2 presents for annual exam. No gyn concerns. Happy with IUD, placed 2021  Gynecologic History No LMP recorded. (Menstrual status: IUD).   Contraception/Family planning: IUD Mirena 6/21 Sexually active: yes Last Pap: 2023. Results were: normal, 2021 Nelva Bush Last mammogram: 7/24. Results were: normal Colonoscopy 7/23 polyps repeat 3 years   Obstetric History OB History  Gravida Para Term Preterm AB Living  2 2       2   SAB IAB Ectopic Multiple Live Births               # Outcome Date GA Lbr Len/2nd Weight Sex Type Anes PTL Lv  2 Para           1 Para              The following portions of the patient's history were reviewed and updated as appropriate: allergies, current medications, past family history, past medical history, past social history, past surgical history, and problem list.  Review of Systems Pertinent items noted in HPI and remainder of comprehensive ROS otherwise negative.   Past medical history, past surgical history, family history and social history were all reviewed and documented in the EPIC chart.   Exam:  Vitals:   10/21/22 1513  BP: 130/86  Pulse: 100  SpO2: 98%  Weight: 206 lb (93.4 kg)  Height: 5' 3.5" (1.613 m)   Body mass index is 35.92 kg/m.  General appearance:  Normal Thyroid:  Symmetrical, normal in size, without palpable masses or nodularity. Respiratory  Auscultation:  Clear without wheezing or rhonchi Cardiovascular  Auscultation:  Regular rate, without rubs, murmurs or gallops  Edema/varicosities:  Not grossly evident Abdominal  Soft,nontender, without masses, guarding or rebound.  Liver/spleen:  No organomegaly noted  Hernia:  None appreciated  Skin  Inspection:  Grossly normal Breasts: Examined lying and sitting.   Right: Without masses, retractions, nipple discharge or axillary adenopathy.   Left: Without masses, retractions, nipple discharge or axillary  adenopathy. Genitourinary   Inguinal/mons:  Normal without inguinal adenopathy  External genitalia:  Normal appearing vulva with no masses, tenderness, or lesions  BUS/Urethra/Skene's glands:  Normal without masses or exudate  Vagina:  Normal appearing with normal color and discharge, no lesions  Cervix:  Normal appearing without discharge or lesions. IUD strings seen  Uterus:  Normal in size, shape and contour.  Mobile, nontender  Adnexa/parametria:     Rt: Normal in size, without masses or tenderness.   Lt: Normal in size, without masses or tenderness.  Anus and perineum: Normal   Raynelle Fanning, CMA present for exam  Assessment/Plan:   1. Well woman exam with routine gynecological exam Pap 2026 Mammo and colonoscopy up to date PCP for labs  2. IUD (intrauterine device) in place Placed 2021     Discussed SBE, colonoscopy and pap screening as directed/appropriate. Recommend of exercise weekly, including weight bearing exercise. Encouraged the use of seatbelts and sunscreen. Return in 1 year for annual or as needed.   Arlie Solomons B WHNP-BC 3:43 PM 10/21/2022

## 2023-02-01 ENCOUNTER — Ambulatory Visit: Payer: 59 | Admitting: Medical-Surgical

## 2023-08-12 ENCOUNTER — Other Ambulatory Visit: Payer: Self-pay | Admitting: Radiology

## 2023-08-12 DIAGNOSIS — Z1231 Encounter for screening mammogram for malignant neoplasm of breast: Secondary | ICD-10-CM

## 2023-08-31 ENCOUNTER — Ambulatory Visit
Admission: RE | Admit: 2023-08-31 | Discharge: 2023-08-31 | Disposition: A | Discharge status: Home / Self Care | Source: Ambulatory Visit | Attending: Radiology | Admitting: Radiology

## 2023-08-31 DIAGNOSIS — Z1231 Encounter for screening mammogram for malignant neoplasm of breast: Secondary | ICD-10-CM

## 2023-10-26 ENCOUNTER — Encounter: Payer: Self-pay | Admitting: Radiology

## 2023-10-26 ENCOUNTER — Other Ambulatory Visit (HOSPITAL_COMMUNITY)
Admission: RE | Admit: 2023-10-26 | Discharge: 2023-10-26 | Disposition: A | Discharge status: Home / Self Care | Source: Ambulatory Visit | Attending: Radiology | Admitting: Radiology

## 2023-10-26 ENCOUNTER — Ambulatory Visit (INDEPENDENT_AMBULATORY_CARE_PROVIDER_SITE_OTHER): Payer: 59 | Admitting: Radiology

## 2023-10-26 VITALS — BP 114/72 | HR 97 | Ht 63.5 in | Wt 172.0 lb

## 2023-10-26 DIAGNOSIS — Z1331 Encounter for screening for depression: Secondary | ICD-10-CM

## 2023-10-26 DIAGNOSIS — R6882 Decreased libido: Secondary | ICD-10-CM

## 2023-10-26 DIAGNOSIS — Z01419 Encounter for gynecological examination (general) (routine) without abnormal findings: Secondary | ICD-10-CM

## 2023-10-26 DIAGNOSIS — N951 Menopausal and female climacteric states: Secondary | ICD-10-CM

## 2023-10-26 DIAGNOSIS — Z975 Presence of (intrauterine) contraceptive device: Secondary | ICD-10-CM

## 2023-10-26 NOTE — Patient Instructions (Signed)
 Preventive Care 48-48 Years Old, Female  Preventive care refers to lifestyle choices and visits with your health care provider that can promote health and wellness. Preventive care visits are also called wellness exams.  What can I expect for my preventive care visit?  Counseling  Your health care provider may ask you questions about your:  Medical history, including:  Past medical problems.  Family medical history.  Pregnancy history.  Current health, including:  Menstrual cycle.  Method of birth control.  Emotional well-being.  Home life and relationship well-being.  Sexual activity and sexual health.  Lifestyle, including:  Alcohol, nicotine or tobacco, and drug use.  Access to firearms.  Diet, exercise, and sleep habits.  Work and work Astronomer.  Sunscreen use.  Safety issues such as seatbelt and bike helmet use.  Physical exam  Your health care provider will check your:  Height and weight. These may be used to calculate your BMI (body mass index). BMI is a measurement that tells if you are at a healthy weight.  Waist circumference. This measures the distance around your waistline. This measurement also tells if you are at a healthy weight and may help predict your risk of certain diseases, such as type 2 diabetes and high blood pressure.  Heart rate and blood pressure.  Body temperature.  Skin for abnormal spots.  What immunizations do I need?    Vaccines are usually given at various ages, according to a schedule. Your health care provider will recommend vaccines for you based on your age, medical history, and lifestyle or other factors, such as travel or where you work.  What tests do I need?  Screening  Your health care provider may recommend screening tests for certain conditions. This may include:  Lipid and cholesterol levels.  Diabetes screening. This is done by checking your blood sugar (glucose) after you have not eaten for a while (fasting).  Pelvic exam and Pap test.  Hepatitis B test.  Hepatitis C  test.  HIV (human immunodeficiency virus) test.  STI (sexually transmitted infection) testing, if you are at risk.  Lung cancer screening.  Colorectal cancer screening.  Mammogram. Talk with your health care provider about when you should start having regular mammograms. This may depend on whether you have a family history of breast cancer.  BRCA-related cancer screening. This may be done if you have a family history of breast, ovarian, tubal, or peritoneal cancers.  Bone density scan. This is done to screen for osteoporosis.  Talk with your health care provider about your test results, treatment options, and if necessary, the need for more tests.  Follow these instructions at home:  Eating and drinking    Eat a diet that includes fresh fruits and vegetables, whole grains, lean protein, and low-fat dairy products.  Take vitamin and mineral supplements as recommended by your health care provider.  Do not drink alcohol if:  Your health care provider tells you not to drink.  You are pregnant, may be pregnant, or are planning to become pregnant.  If you drink alcohol:  Limit how much you have to 0-1 drink a day.  Know how much alcohol is in your drink. In the [place] one drink equals one 12 oz bottle of beer (355 mL), one 5 oz glass of wine (148 mL), or one 1 oz glass of hard liquor (44 mL).  Lifestyle  Brush your teeth every morning and night with fluoride toothpaste. Floss one time each day.  Exercise for at least  30 minutes 5 or more days each week.  Do not use any products that contain nicotine or tobacco. These products include cigarettes, chewing tobacco, and vaping devices, such as e-cigarettes. If you need help quitting, ask your health care provider.  Do not use drugs.  If you are sexually active, practice safe sex. Use a condom or other form of protection to prevent STIs.  If you do not wish to become pregnant, use a form of birth control. If you plan to become pregnant, see your health care provider for a  prepregnancy visit.  Take aspirin only as told by your health care provider. Make sure that you understand how much to take and what form to take. Work with your health care provider to find out whether it is safe and beneficial for you to take aspirin daily.  Find healthy ways to manage stress, such as:  Meditation, yoga, or listening to music.  Journaling.  Talking to a trusted person.  Spending time with friends and family.  Minimize exposure to UV radiation to reduce your risk of skin cancer.  Safety  Always wear your seat belt while driving or riding in a vehicle.  Do not drive:  If you have been drinking alcohol. Do not ride with someone who has been drinking.  When you are tired or distracted.  While texting.  If you have been using any mind-altering substances or drugs.  Wear a helmet and other protective equipment during sports activities.  If you have firearms in your house, make sure you follow all gun safety procedures.  Seek help if you have been physically or sexually abused.  What's next?  Visit your health care provider once a year for an annual wellness visit.  Ask your health care provider how often you should have your eyes and teeth checked.  Stay up to date on all vaccines.  This information is not intended to replace advice given to you by your health care provider. Make sure you discuss any questions you have with your health care provider.  Document Revised: 08/13/2020 Document Reviewed: 08/13/2020  Elsevier Patient Education  2024 [employer].

## 2023-10-26 NOTE — Progress Notes (Signed)
 [name] 03-12-1975 [id number]   History:  48 y.o. G2P2 presents for annual exam. Happy with IUD, placed 2021. Desires pap today, aware of recommendations. C/o low libido, hot flashes, night sweats.   Gynecologic History No LMP recorded. (Menstrual status: IUD).   Contraception/Family planning: IUD Mirena  6/21 Sexually active: yes Last Pap: 2023. Results were: normal, 2021 Normal Last mammogram: 08/31/23. Results were: normal Colonoscopy 7/23 polyps repeat 3 years   Obstetric History OB History  Gravida Para Term Preterm AB Living  2 2    2   SAB IAB Ectopic Multiple Live Births          # Outcome Date GA Lbr Len/2nd Weight Sex Type Anes PTL Lv  2 Para           1 Para               10/26/2023    2:17 PM 10/11/2022    2:47 PM 05/11/2022    3:44 PM 02/09/2022    1:12 PM 02/03/2022    9:34 AM  Depression screen PHQ 2/9  Decreased Interest 0 0 0 0 0  Down, Depressed, Hopeless 0 0 0 0 0  PHQ - 2 Score 0 0 0 0 0     The following portions of the patient's history were reviewed and updated as appropriate: allergies, current medications, past family history, past medical history, past social history, past surgical history, and problem list.  Review of Systems Pertinent items noted in HPI and remainder of comprehensive ROS otherwise negative.   Past medical history, past surgical history, family history and social history were all reviewed and documented in the EPIC chart.   Exam:  Vitals:   10/26/23 1415  BP: 114/72  Pulse: 97  SpO2: 98%  Weight: 172 lb (78 kg)  Height: 5' 3.5 (1.613 m)    Body mass index is 29.99 kg/m.  General appearance:  Normal Thyroid :  Symmetrical, normal in size, without palpable masses or nodularity. Respiratory  Auscultation:  Clear without wheezing or rhonchi Cardiovascular  Auscultation:  Regular rate, without rubs, murmurs or gallops  Edema/varicosities:  Not grossly evident Abdominal  Soft,nontender, without masses,  guarding or rebound.  Liver/spleen:  No organomegaly noted  Hernia:  None appreciated  Skin  Inspection:  Grossly normal Breasts: Examined lying and sitting.   Right: Without masses, retractions, nipple discharge or axillary adenopathy.   Left: Without masses, retractions, nipple discharge or axillary adenopathy. Genitourinary   Inguinal/mons:  Normal without inguinal adenopathy  External genitalia:  Normal appearing vulva with no masses, tenderness, or lesions  BUS/Urethra/Skene's glands:  Normal without masses or exudate  Vagina:  Normal appearing with normal color and discharge, no lesions  Cervix:  Normal appearing without discharge or lesions. IUD strings seen in os  Uterus:  Normal in size, shape and contour.  Mobile, nontender  Adnexa/parametria:     Rt: Normal in size, without masses or tenderness.   Lt: Normal in size, without masses or tenderness.  Anus and perineum: Normal   Darice Hoit, CMA present for exam  Assessment/Plan:   1. Well woman exam with routine gynecological exam (Primary) - Cytology - PAP( [place])  2. IUD (intrauterine device) in place Placed 2021  3. Low libido  - Testos,Total,Free and SHBG (Female)  4. Perimenopausal symptoms Open to HRT, will check levels and contact with results - Estradiol  - FSH  5. Depression screening     Return in 1 year for annual or as  needed.   GINETTE COZIER B WHNP-BC 2:37 PM 10/26/2023

## 2023-10-28 LAB — CYTOLOGY - PAP
Comment: NEGATIVE
Diagnosis: NEGATIVE
High risk HPV: NEGATIVE

## 2023-10-31 LAB — ESTRADIOL: Estradiol: 383 pg/mL — ABNORMAL HIGH

## 2023-10-31 LAB — TESTOS,TOTAL,FREE AND SHBG (FEMALE)
Free Testosterone: 1 pg/mL (ref 0.1–6.4)
Sex Hormone Binding: 119 nmol/L (ref 17–124)
Testosterone, Total, LC-MS-MS: 16 ng/dL (ref 2–45)

## 2023-10-31 LAB — FOLLICLE STIMULATING HORMONE: FSH: 1.7 m[IU]/mL

## 2023-11-01 ENCOUNTER — Encounter: Payer: Self-pay | Admitting: Sports Medicine

## 2023-11-02 ENCOUNTER — Ambulatory Visit: Payer: Self-pay | Admitting: Radiology

## 2023-11-02 DIAGNOSIS — N951 Menopausal and female climacteric states: Secondary | ICD-10-CM

## 2023-11-10 ENCOUNTER — Other Ambulatory Visit

## 2023-11-10 DIAGNOSIS — N951 Menopausal and female climacteric states: Secondary | ICD-10-CM

## 2023-11-11 ENCOUNTER — Other Ambulatory Visit: Payer: Self-pay | Admitting: Radiology

## 2023-11-11 ENCOUNTER — Ambulatory Visit: Payer: Self-pay | Admitting: Radiology

## 2023-11-11 DIAGNOSIS — N951 Menopausal and female climacteric states: Secondary | ICD-10-CM

## 2023-11-11 LAB — ESTRADIOL: Estradiol: 179 pg/mL

## 2023-11-11 MED ORDER — ESTRADIOL 0.025 MG/24HR TD PTTW: 1.0000 | MEDICATED_PATCH | TRANSDERMAL | 2 refills | Status: DC

## 2024-01-30 ENCOUNTER — Other Ambulatory Visit: Payer: Self-pay | Admitting: Radiology

## 2024-01-30 DIAGNOSIS — N951 Menopausal and female climacteric states: Secondary | ICD-10-CM

## 2024-01-30 NOTE — Telephone Encounter (Signed)
 Med refill request: vivelle   Last AEX: 10/26/23  Next AEX: n/a Last MMG (if hormonal med) 08/31/23 birads cat 1 neg Refill authorized: Last rx 11/14/23 #8 with 2 refills. Please Advise?

## 2024-02-03 ENCOUNTER — Other Ambulatory Visit: Payer: Self-pay | Admitting: Radiology

## 2024-02-03 DIAGNOSIS — N951 Menopausal and female climacteric states: Secondary | ICD-10-CM

## 2024-02-03 NOTE — Telephone Encounter (Signed)
 Med refill request: estradiol  0.025 mg patch Last AEX: 10/26/23 Next AEX: none scheduled Last MMG (if hormonal med) 08/31/23 BI-RADS 1 negative Duplicate request.

## 2024-02-06 ENCOUNTER — Other Ambulatory Visit: Payer: Self-pay | Admitting: Nurse Practitioner

## 2024-02-06 DIAGNOSIS — N951 Menopausal and female climacteric states: Secondary | ICD-10-CM

## 2024-02-06 MED ORDER — ESTRADIOL 0.025 MG/24HR TD PTTW: 1.0000 | MEDICATED_PATCH | TRANSDERMAL | 0 refills | Status: DC

## 2024-02-06 NOTE — Telephone Encounter (Signed)
 Pt left a voicemail wanting to know why her refill request for the estradiol  was denied.  Pt was last seen October 26, 2023 (AEX). Please advise.

## 2024-02-06 NOTE — Telephone Encounter (Signed)
 Refill sent

## 2024-03-01 ENCOUNTER — Other Ambulatory Visit: Payer: Self-pay | Admitting: Nurse Practitioner

## 2024-03-01 DIAGNOSIS — N951 Menopausal and female climacteric states: Secondary | ICD-10-CM

## 2024-03-02 NOTE — Telephone Encounter (Signed)
 Med refill request:   estradiol  (VIVELLE -DOT) 0.025 MG/24HR  Start:  02/06/24 Disp:  8 patches Refills:  0  Last AEX:  10/26/23 Next AEX:  Not yet scheduled Last MMG (if hormonal med):  08/31/23 Refill authorized? Please Advise.

## 2024-03-28 ENCOUNTER — Other Ambulatory Visit: Payer: Self-pay | Admitting: Nurse Practitioner

## 2024-03-28 DIAGNOSIS — N951 Menopausal and female climacteric states: Secondary | ICD-10-CM

## 2024-03-28 NOTE — Telephone Encounter (Signed)
 Med refill request: estradiol  (vivelle -dot) 0.025 mg patch Last AEX: 10/26/23 JC Next AEX: not yet scheduled Last MMG (if hormonal med) 08/31/23 Refill authorized: Please Advise? Last Rx sent #8 with zero refills on 03/02/24 TW
